# Patient Record
Sex: Male | Born: 1935 | Race: White | Hispanic: No | Marital: Married | Smoking: Never smoker
Health system: Southern US, Community
[De-identification: ages and names within clinical notes are randomized; demographics above are authoritative.]

## PROBLEM LIST (undated history)

## (undated) DIAGNOSIS — E119 Type 2 diabetes mellitus without complications: Secondary | ICD-10-CM

## (undated) DIAGNOSIS — I10 Essential (primary) hypertension: Secondary | ICD-10-CM

## (undated) DIAGNOSIS — N400 Enlarged prostate without lower urinary tract symptoms: Secondary | ICD-10-CM

## (undated) DIAGNOSIS — E785 Hyperlipidemia, unspecified: Secondary | ICD-10-CM

## (undated) DIAGNOSIS — F0390 Unspecified dementia without behavioral disturbance: Secondary | ICD-10-CM

---

## 1998-03-11 ENCOUNTER — Ambulatory Visit (HOSPITAL_COMMUNITY): Admission: RE | Admit: 1998-03-11 | Discharge: 1998-03-11 | Payer: Self-pay | Admitting: Gastroenterology

## 1998-03-26 ENCOUNTER — Encounter: Admission: RE | Admit: 1998-03-26 | Discharge: 1998-06-24 | Payer: Self-pay | Admitting: Internal Medicine

## 2000-03-15 ENCOUNTER — Encounter (INDEPENDENT_AMBULATORY_CARE_PROVIDER_SITE_OTHER): Payer: Self-pay | Admitting: Specialist

## 2000-03-15 ENCOUNTER — Other Ambulatory Visit: Admission: RE | Admit: 2000-03-15 | Discharge: 2000-03-15 | Payer: Self-pay | Admitting: Urology

## 2001-03-18 ENCOUNTER — Encounter: Payer: Self-pay | Admitting: Internal Medicine

## 2001-03-18 ENCOUNTER — Encounter: Admission: RE | Admit: 2001-03-18 | Discharge: 2001-03-18 | Payer: Self-pay | Admitting: Internal Medicine

## 2001-03-27 ENCOUNTER — Ambulatory Visit (HOSPITAL_COMMUNITY): Admission: RE | Admit: 2001-03-27 | Discharge: 2001-03-27 | Payer: Self-pay | Admitting: Internal Medicine

## 2001-03-27 ENCOUNTER — Encounter: Payer: Self-pay | Admitting: Internal Medicine

## 2003-01-12 ENCOUNTER — Ambulatory Visit (HOSPITAL_COMMUNITY): Admission: RE | Admit: 2003-01-12 | Discharge: 2003-01-12 | Payer: Self-pay | Admitting: Specialist

## 2004-12-16 ENCOUNTER — Encounter (INDEPENDENT_AMBULATORY_CARE_PROVIDER_SITE_OTHER): Payer: Self-pay | Admitting: *Deleted

## 2004-12-16 ENCOUNTER — Ambulatory Visit (HOSPITAL_COMMUNITY): Admission: RE | Admit: 2004-12-16 | Discharge: 2004-12-16 | Payer: Self-pay | Admitting: General Surgery

## 2007-05-21 ENCOUNTER — Encounter: Admission: RE | Admit: 2007-05-21 | Discharge: 2007-05-21 | Payer: Self-pay | Admitting: General Surgery

## 2010-03-15 ENCOUNTER — Encounter
Admission: RE | Admit: 2010-03-15 | Discharge: 2010-03-15 | Payer: Self-pay | Source: Home / Self Care | Attending: Internal Medicine | Admitting: Internal Medicine

## 2010-07-08 NOTE — Op Note (Signed)
Brett Mckee, Brett Mckee NO.:  [id number]   MEDICAL RECORD NO.:  [id number]          PATIENT TYPE:  AMB   LOCATION:  SDS                          FACILITY:  [facility]   PHYSICIAN:  Angelia Mould. Derrell Lolling, M.D.DATE OF BIRTH:  Oct 28, 1926   DATE OF PROCEDURE:  12/16/2004  DATE OF DISCHARGE:                                 OPERATIVE REPORT   PREOPERATIVE DIAGNOSIS:  1.  Soft tissue mass right neck, suspect sebaceous cyst.  2.  Pigmented nevus right chest wall.   POSTOPERATIVE DIAGNOSIS:  1.  Soft tissue mass right neck, suspect sebaceous cyst.  2.  Pigmented nevus right chest wall.   OPERATION PERFORMED:  1.  Excision 1.5 cm soft tissue mass of the right neck.  2.  Excision 1 cm pigmented nevus of right chest wall.   SURGEON:  Angelia Mould. Derrell Lolling, M.D.   OPERATIVE INDICATIONS:  This is a 77 year old white male who has had a  cystic soft tissue mass in the right mid anterior neck for 15 years  sometimes draining foul smelling fluid but has never required surgery.  He  also has a pigmented nevus of the right anterior chest wall lateral to the  nipple which is raised and has pigmentation and he is concerned about the  color and diagnosis.  He is brought to the minor procedure room at [facility] for elective excision.   SURGICAL TECHNIQUE:  The patient was placed supine on the operating table  with his neck turned to the left.  He was in a semi-Fowler's position.  The  neck and chest wall were prepped and draped in a sterile fashion.  1%  Xylocaine with epinephrine was used as a local infiltration anesthetic.   We first made a transverse elliptical incision in the right neck overlying  the neck mass.  This was excised down to the deep subcutaneous tissue to get  this all the way out.  This was sent for routine histology.  The  subcutaneous tissues were closed with interrupted sutures of 3-0 Vicryl.  The skin was closed with a running simple suture of 6-0  nylon.   The right chest wall mass, likewise, was excised with a transverse  elliptical incision with about a 1-2 mm margin.  This dissection was carried  down into the mid subcutaneous tissue.  I was able to close this incision  with 5-6 interrupted sutures of 4-0 nylon.   Both of these wounds were cleansed, dried, and covered with a sterile  bandage.  The patient tolerated the procedure well and was returned to the  waiting area in excellent condition.  Estimated blood loss about 5 mL.  Complications none.  Sponge and instrument counts were correct.      Angelia Mould. Derrell Lolling, M.D.  Electronically Signed     HMI/MEDQ  D:  12/16/2004  T:  12/16/2004  Job:  [id number]   cc:   Theressa Millard, M.D.  Fax: [phone]

## 2012-12-01 ENCOUNTER — Emergency Department (HOSPITAL_COMMUNITY): Payer: Medicare Other

## 2012-12-01 ENCOUNTER — Observation Stay (HOSPITAL_COMMUNITY): Payer: Medicare Other

## 2012-12-01 ENCOUNTER — Observation Stay (HOSPITAL_COMMUNITY)
Admission: EM | Admit: 2012-12-01 | Discharge: 2012-12-03 | Payer: Medicare Other | Attending: Internal Medicine | Admitting: Internal Medicine

## 2012-12-01 DIAGNOSIS — R279 Unspecified lack of coordination: Secondary | ICD-10-CM | POA: Insufficient documentation

## 2012-12-01 DIAGNOSIS — Z7982 Long term (current) use of aspirin: Secondary | ICD-10-CM | POA: Insufficient documentation

## 2012-12-01 DIAGNOSIS — N400 Enlarged prostate without lower urinary tract symptoms: Secondary | ICD-10-CM | POA: Insufficient documentation

## 2012-12-01 DIAGNOSIS — R609 Edema, unspecified: Secondary | ICD-10-CM | POA: Diagnosis present

## 2012-12-01 DIAGNOSIS — F0390 Unspecified dementia without behavioral disturbance: Secondary | ICD-10-CM | POA: Diagnosis present

## 2012-12-01 DIAGNOSIS — Z79899 Other long term (current) drug therapy: Secondary | ICD-10-CM | POA: Insufficient documentation

## 2012-12-01 DIAGNOSIS — E860 Dehydration: Secondary | ICD-10-CM | POA: Diagnosis present

## 2012-12-01 DIAGNOSIS — W19XXXA Unspecified fall, initial encounter: Secondary | ICD-10-CM

## 2012-12-01 DIAGNOSIS — R270 Ataxia, unspecified: Secondary | ICD-10-CM

## 2012-12-01 DIAGNOSIS — E785 Hyperlipidemia, unspecified: Secondary | ICD-10-CM | POA: Insufficient documentation

## 2012-12-01 DIAGNOSIS — N179 Acute kidney failure, unspecified: Principal | ICD-10-CM | POA: Diagnosis present

## 2012-12-01 DIAGNOSIS — I10 Essential (primary) hypertension: Secondary | ICD-10-CM | POA: Diagnosis present

## 2012-12-01 HISTORY — DX: Hyperlipidemia, unspecified: E78.5

## 2012-12-01 HISTORY — DX: Benign prostatic hyperplasia without lower urinary tract symptoms: N40.0

## 2012-12-01 HISTORY — DX: Unspecified dementia, unspecified severity, without behavioral disturbance, psychotic disturbance, mood disturbance, and anxiety: F03.90

## 2012-12-01 LAB — URINALYSIS, ROUTINE W REFLEX MICROSCOPIC
Bilirubin Urine: NEGATIVE
Glucose, UA: NEGATIVE mg/dL
Hgb urine dipstick: NEGATIVE
Ketones, ur: NEGATIVE mg/dL
Protein, ur: NEGATIVE mg/dL
Urobilinogen, UA: 0.2 mg/dL (ref 0.0–1.0)

## 2012-12-01 LAB — BASIC METABOLIC PANEL
BUN: 46 mg/dL — ABNORMAL HIGH (ref 6–23)
Calcium: 8.7 mg/dL (ref 8.4–10.5)
GFR calc non Af Amer: 23 mL/min — ABNORMAL LOW (ref >90)
Sodium: 139 mEq/L (ref 135–145)

## 2012-12-01 LAB — CBC WITH DIFFERENTIAL/PLATELET
Basophils Absolute: 0 10*3/uL (ref 0.0–0.1)
Basophils Relative: 0 % (ref 0–1)
Eosinophils Absolute: 0 10*3/uL (ref 0.0–0.7)
Eosinophils Relative: 0 % (ref 0–5)
MCH: 31.7 pg (ref 26.0–34.0)
MCV: 92.7 fL (ref 78.0–100.0)
Neutro Abs: 9.9 10*3/uL — ABNORMAL HIGH (ref 1.7–7.7)
Platelets: 162 10*3/uL (ref 150–400)
RDW: 13.8 % (ref 11.5–15.5)
WBC: 12.4 10*3/uL — ABNORMAL HIGH (ref 4.0–10.5)

## 2012-12-01 LAB — URINE MICROSCOPIC-ADD ON

## 2012-12-01 LAB — TROPONIN I: Troponin I: <0.3 ng/mL (ref <0.30)

## 2012-12-01 LAB — GLUCOSE, CAPILLARY: Glucose-Capillary: 131 mg/dL — ABNORMAL HIGH (ref 70–99)

## 2012-12-01 MED ORDER — SODIUM CHLORIDE 0.9 % IJ SOLN
3.0000 mL | Freq: Two times a day (BID) | INTRAMUSCULAR | Status: DC
  Administered 2012-12-01 – 2012-12-03 (×4): 3 mL via INTRAVENOUS

## 2012-12-01 MED ORDER — ASPIRIN EC 81 MG PO TBEC
81.0000 mg | DELAYED_RELEASE_TABLET | Freq: Every day | ORAL | Status: DC
  Administered 2012-12-02 – 2012-12-03 (×2): 81 mg via ORAL
  Filled 2012-12-01 (×2): qty 1

## 2012-12-01 MED ORDER — ASPIRIN 81 MG PO TABS: 81.0000 mg | ORAL_TABLET | Freq: Every day | ORAL | Status: DC

## 2012-12-01 MED ORDER — HEPARIN SODIUM (PORCINE) 5000 UNIT/ML IJ SOLN
5000.0000 [IU] | Freq: Three times a day (TID) | INTRAMUSCULAR | Status: DC
  Administered 2012-12-02 – 2012-12-03 (×3): 5000 [IU] via SUBCUTANEOUS
  Filled 2012-12-01 (×8): qty 1

## 2012-12-01 MED ORDER — TAMSULOSIN HCL 0.4 MG PO CAPS
0.4000 mg | ORAL_CAPSULE | Freq: Every day | ORAL | Status: DC
  Administered 2012-12-02 – 2012-12-03 (×2): 0.4 mg via ORAL
  Filled 2012-12-01 (×2): qty 1

## 2012-12-01 MED ORDER — MEMANTINE HCL 10 MG PO TABS
10.0000 mg | ORAL_TABLET | Freq: Two times a day (BID) | ORAL | Status: DC
  Administered 2012-12-01 – 2012-12-03 (×4): 10 mg via ORAL
  Filled 2012-12-01 (×5): qty 1

## 2012-12-01 MED ORDER — LORAZEPAM 0.5 MG PO TABS
0.5000 mg | ORAL_TABLET | Freq: Four times a day (QID) | ORAL | Status: DC | PRN
  Administered 2012-12-03: 0.5 mg via ORAL
  Filled 2012-12-01: qty 1

## 2012-12-01 MED ORDER — SODIUM CHLORIDE 0.9 % IV BOLUS (SEPSIS)
1000.0000 mL | Freq: Once | INTRAVENOUS | Status: AC
  Administered 2012-12-01: 1000 mL via INTRAVENOUS

## 2012-12-01 MED ORDER — DONEPEZIL HCL 10 MG PO TABS
10.0000 mg | ORAL_TABLET | Freq: Every day | ORAL | Status: DC
  Administered 2012-12-01 – 2012-12-02 (×2): 10 mg via ORAL
  Filled 2012-12-01 (×3): qty 1

## 2012-12-01 MED ORDER — FAMOTIDINE 20 MG PO TABS
20.0000 mg | ORAL_TABLET | Freq: Every day | ORAL | Status: DC
  Administered 2012-12-02 – 2012-12-03 (×2): 20 mg via ORAL
  Filled 2012-12-01 (×2): qty 1

## 2012-12-01 MED ORDER — ACETAMINOPHEN 325 MG PO TABS: 650.0000 mg | ORAL_TABLET | Freq: Four times a day (QID) | ORAL | Status: DC | PRN

## 2012-12-01 MED ORDER — SIMVASTATIN 20 MG PO TABS
20.0000 mg | ORAL_TABLET | Freq: Every evening | ORAL | Status: DC
  Administered 2012-12-01 – 2012-12-02 (×2): 20 mg via ORAL
  Filled 2012-12-01 (×3): qty 1

## 2012-12-01 MED ORDER — SODIUM CHLORIDE 0.9 % IV SOLN
INTRAVENOUS | Status: DC
  Administered 2012-12-01: 20:00:00 via INTRAVENOUS

## 2012-12-01 NOTE — ED Notes (Signed)
PT was found on floor by staff at [facility] . Pt lives at [facility] .

## 2012-12-01 NOTE — ED Provider Notes (Signed)
CSN: [id number]     Arrival date & time 12/01/12  1212 History   First MD Initiated Contact with Patient 12/01/12 1238     Chief Complaint  Patient presents with  . Fall   (Consider location/radiation/quality/duration/timing/severity/associated sxs/prior Treatment) Patient is a 79 y.o. male presenting with fall.  Fall   Level 5 caveat due to dementia Pt with moderate to severe dementia brought to the ED from SNF where he had unwitnessed fall this AM. Found on the floor, unable to provide any history of the event, but denies any complaints.   No past medical history on file. No past surgical history on file. No family history on file. History  Substance Use Topics  . Smoking status: Not on file  . Smokeless tobacco: Not on file  . Alcohol Use: Not on file    Review of Systems Unable to assess due to mental status.   Allergies  Review of patient's allergies indicates not on file.  Home Medications   Current Outpatient Rx  Name  Route  Sig  Dispense  Refill  . acetaminophen (TYLENOL) 500 MG tablet   Oral   Take 1,000 mg by mouth 2 (two) times daily.         Marland Kitchen aspirin 81 MG tablet   Oral   Take 81 mg by mouth daily.         Marland Kitchen donepezil (ARICEPT) 10 MG tablet   Oral   Take 10 mg by mouth at bedtime as needed.         . furosemide (LASIX) 40 MG tablet   Oral   Take 40 mg by mouth 2 (two) times daily.         Marland Kitchen LORazepam (ATIVAN) 0.5 MG tablet   Oral   Take 0.5 mg by mouth every 6 (six) hours as needed for anxiety (agitation).         . memantine (NAMENDA) 10 MG tablet   Oral   Take 10 mg by mouth 2 (two) times daily.         . quinapril (ACCUPRIL) 40 MG tablet   Oral   Take 40 mg by mouth daily.         . ranitidine (ZANTAC) 75 MG tablet   Oral   Take 150 mg by mouth 2 (two) times daily.         . simvastatin (ZOCOR) 20 MG tablet   Oral   Take 20 mg by mouth every evening.         . tamsulosin (FLOMAX) 0.4 MG CAPS capsule   Oral  Take 0.4 mg by mouth daily.          BP 121/58  Pulse 73  Temp(Src) 97.4 F (36.3 C) (Oral)  Resp 20  SpO2 100% Physical Exam  Nursing note and vitals reviewed. Constitutional: He appears well-developed and well-nourished.  HENT:  Head: Atraumatic.  Abrasion R frontal scalp  Eyes: EOM are normal. Pupils are equal, round, and reactive to light.  Neck:  In c-collar  Cardiovascular: Normal rate, normal heart sounds and intact distal pulses.   Pulmonary/Chest: Effort normal and breath sounds normal.  Abdominal: Bowel sounds are normal. He exhibits no distension. There is no tenderness.  Musculoskeletal: Normal range of motion. He exhibits no edema and no tenderness.  Neurological: He is alert. He has normal strength. No cranial nerve deficit or sensory deficit.  Skin: Skin is warm and dry. No rash noted.  Psychiatric: He has a normal mood  and affect.    ED Course  Procedures (including critical care time) Labs Review Labs Reviewed  CBC WITH DIFFERENTIAL - Abnormal; Notable for the following:    WBC 12.4 (*)    Neutrophils Relative % 80 (*)    Neutro Abs 9.9 (*)    All other components within normal limits  BASIC METABOLIC PANEL - Abnormal; Notable for the following:    Glucose, Bld 170 (*)    BUN 46 (*)    Creatinine, Ser 2.38 (*)    GFR calc non Af Amer 23 (*)    GFR calc Af Amer 27 (*)    All other components within normal limits  TROPONIN I  URINALYSIS, ROUTINE W REFLEX MICROSCOPIC   Imaging Review Dg Chest 2 View  12/01/2012   CLINICAL DATA:  History of fall. Syncope. Confusion.  EXAM: CHEST  2 VIEW  COMPARISON:  CHEST x-ray 05/21/2007.  FINDINGS: Low lung volumes. There is cephalization of the pulmonary vasculature and slight indistinctness of the interstitial markings suggestive of mild pulmonary edema. Trace bilateral pleural effusions. Mild cardiomegaly. The patient is rotated to the left on today's exam, resulting in distortion of the mediastinal contours and  reduced diagnostic sensitivity and specificity for mediastinal pathology. Atherosclerosis in the thoracic aorta.  IMPRESSION: 1. Findings, as above, concerning for mild congestive heart failure. 2. Atherosclerosis.   Electronically Signed   By: Trudie Reed M.D.   On: 12/01/2012 13:43    EKG Interpretation     Ventricular Rate:  73 PR Interval:  190 QRS Duration: 110 QT Interval:  376 QTC Calculation: 415 R Axis:   56 Text Interpretation:  Sinus rhythm Inferior infarct, old No old tracing to compare            MDM   1. Fall, initial encounter   2. Acute renal failure   3. Ataxia     Family at bedside states patient is at baseline. He is normally independent, does not use walker or wheelchair. Will ensure he is able to ambulate without difficulty and plan to return to SNF. Family amenable to this plan.   3:57 PM Pt with elevated Cr with no history of same per daughter. He is ataxic with trying to walk, not safe to go back to SNF. Will plan admission for further eval. IVF ordered. UA pending.   Jaycee Mckellips B. Bernette Mayers, MD 12/01/12 1558

## 2012-12-01 NOTE — H&P (Signed)
[facility] History and Physical  Brett Dora Clauss ZOX:[id number] DOB: Jun 14, 1926 DOA: 12/01/2012  Referring physician:  PCP: No primary provider on file.  Specialists:   Chief Complaint: fall, ? syncope   HPI: Brett Mckee is a 79 y.o. male with PMH of HTN, DJD, HPL, BPH, dementia presented with fall. He was brought from assisted living where he had unwitnessed fall; He has dementia and pleasantly confused, denies chest pain, no SOB, no fever, no cough, no focal neuro symptoms, no nausea, vomiting or diarrhea;  unable to provide any history of the event   Review of Systems: The patient denies anorexia, fever, weight loss,, vision loss, decreased hearing, hoarseness, chest pain, dyspnea on exertion, peripheral edema,hemoptysis, abdominal pain, melena, hematochezia, severe indigestion/heartburn, hematuria, incontinence, genital sores, muscle weakness, suspicious skin lesions, transient blindness, difficulty walking, depression, unusual weight change, abnormal bleeding, enlarged lymph nodes, angioedema, and breast masses.    No past medical history on file. No past surgical history on file. Social History:  has no tobacco, alcohol, and drug history on file. ALF;  where does patient live--home, ALF, SNF? and with whom if at home? Yes:  Can patient participate in ADLs?  Not on File  No family history on file.  Denies h/o MI, CAD  (be sure to complete)  Prior to Admission medications   Medication Sig Start Date End Date Taking? Authorizing Provider  acetaminophen (TYLENOL) 500 MG tablet Take 1,000 mg by mouth 2 (two) times daily.   Yes Historical Provider, MD  aspirin 81 MG tablet Take 81 mg by mouth daily.   Yes Historical Provider, MD  donepezil (ARICEPT) 10 MG tablet Take 10 mg by mouth at bedtime as needed.   Yes Historical Provider, MD  furosemide (LASIX) 40 MG tablet Take 40 mg by mouth 2 (two) times daily.   Yes Historical Provider, MD  LORazepam (ATIVAN) 0.5 MG tablet  Take 0.5 mg by mouth every 6 (six) hours as needed for anxiety (agitation).   Yes Historical Provider, MD  memantine (NAMENDA) 10 MG tablet Take 10 mg by mouth 2 (two) times daily.   Yes Historical Provider, MD  quinapril (ACCUPRIL) 40 MG tablet Take 40 mg by mouth daily.   Yes Historical Provider, MD  ranitidine (ZANTAC) 75 MG tablet Take 150 mg by mouth 2 (two) times daily.   Yes Historical Provider, MD  simvastatin (ZOCOR) 20 MG tablet Take 20 mg by mouth every evening.   Yes Historical Provider, MD  tamsulosin (FLOMAX) 0.4 MG CAPS capsule Take 0.4 mg by mouth daily.   Yes Historical Provider, MD   Physical Exam: Filed Vitals:   12/01/12 1236  BP: 121/58  Pulse: 73  Temp: 97.4 F (36.3 C)  Resp: 20     General:  Alert, confused   Eyes: eom-i  ENT: no oral ulcers   Neck: supple,   Cardiovascular: s1,s2 rrr  Respiratory: CTA bl   Abdomen: soft, nt, nd   Skin: ecchymosis   Musculoskeletal: LE edema   Psychiatric: no hallucinations   Neurologic: CN 2-12 intact, motor 5/5 bl symmetric   Labs on Admission:  Basic Metabolic Panel:  Recent Labs Lab 12/01/12 1255  NA 139  K 4.2  CL 100  CO2 27  GLUCOSE 170*  BUN 46*  CREATININE 2.38*  CALCIUM 8.7   Liver Function Tests: No results found for this basename: AST, ALT, ALKPHOS, BILITOT, PROT, ALBUMIN,  in the last 168 hours No results found for this basename: LIPASE, AMYLASE,  in the last 168 hours No results found for this basename: AMMONIA,  in the last 168 hours CBC:  Recent Labs Lab 12/01/12 1255  WBC 12.4*  NEUTROABS 9.9*  HGB 13.4  HCT 39.2  MCV 92.7  PLT 162   Cardiac Enzymes:  Recent Labs Lab 12/01/12 1255  TROPONINI <0.30    BNP (last 3 results) No results found for this basename: PROBNP,  in the last 8760 hours CBG: No results found for this basename: GLUCAP,  in the last 168 hours  Radiological Exams on Admission: Dg Chest 2 View  12/01/2012   CLINICAL DATA:  History of fall.  Syncope. Confusion.  EXAM: CHEST  2 VIEW  COMPARISON:  CHEST x-ray 05/21/2007.  FINDINGS: Low lung volumes. There is cephalization of the pulmonary vasculature and slight indistinctness of the interstitial markings suggestive of mild pulmonary edema. Trace bilateral pleural effusions. Mild cardiomegaly. The patient is rotated to the left on today's exam, resulting in distortion of the mediastinal contours and reduced diagnostic sensitivity and specificity for mediastinal pathology. Atherosclerosis in the thoracic aorta.  IMPRESSION: 1. Findings, as above, concerning for mild congestive heart failure. 2. Atherosclerosis.   Electronically Signed   By: Trudie Reed M.D.   On: 12/01/2012 13:43   Ct Head Wo Contrast  12/01/2012   CLINICAL DATA:  79 year old male with fall, headache and neck pain.  EXAM: CT HEAD WITHOUT CONTRAST  CT CERVICAL SPINE WITHOUT CONTRAST  TECHNIQUE: Multidetector CT imaging of the head and cervical spine was performed following the standard protocol without intravenous contrast. Multiplanar CT image reconstructions of the cervical spine were also generated.  COMPARISON:  03/15/2010 head CT  FINDINGS: CT HEAD FINDINGS  Generalized cerebral volume loss and chronic small-vessel white matter ischemic changes are again noted.  No acute intracranial abnormalities are identified, including mass lesion or mass effect, hydrocephalus, extra-axial fluid collection, midline shift, hemorrhage, or acute infarction.  The visualized bony calvarium is unremarkable.  Small air-fluid levels within the ethmoid and left maxillary sinus is noted.  CT CERVICAL SPINE FINDINGS  Reversal of the normal cervical lordosis is noted.  There is no evidence of acute fracture, subluxation or prevertebral soft tissue swelling.  Multilevel degenerative disc disease and facet arthropathy noted throughout the cervical spine, moderate to severe at C5-6 and C6-7. These degenerative changes contribute to moderate to severe  central spinal and by foraminal narrowing at C5-6 and C6-7. Mild central spinal and foraminal narrowing is identified at several other levels.  The soft tissue structures are unremarkable.  IMPRESSION: CT HEAD IMPRESSION:  No evidence of acute intracranial abnormality.  Atrophy and chronic small-vessel white matter ischemic changes.  Sinus disease as described.  CT CERVICAL SPINE IMPRESSION:  No static evidence of acute injury to the cervical spine.  Multilevel degenerative changes, greatest at C5-6 and C6-7, contributing to moderate to severe central spinal and bony foraminal narrowing from C5-C7.   Electronically Signed   By: Laveda Abbe M.D.   On: 12/01/2012 14:25   Ct Cervical Spine Wo Contrast  12/01/2012   CLINICAL DATA:  79 year old male with fall, headache and neck pain.  EXAM: CT HEAD WITHOUT CONTRAST  CT CERVICAL SPINE WITHOUT CONTRAST  TECHNIQUE: Multidetector CT imaging of the head and cervical spine was performed following the standard protocol without intravenous contrast. Multiplanar CT image reconstructions of the cervical spine were also generated.  COMPARISON:  03/15/2010 head CT  FINDINGS: CT HEAD FINDINGS  Generalized cerebral volume loss and chronic small-vessel white matter  ischemic changes are again noted.  No acute intracranial abnormalities are identified, including mass lesion or mass effect, hydrocephalus, extra-axial fluid collection, midline shift, hemorrhage, or acute infarction.  The visualized bony calvarium is unremarkable.  Small air-fluid levels within the ethmoid and left maxillary sinus is noted.  CT CERVICAL SPINE FINDINGS  Reversal of the normal cervical lordosis is noted.  There is no evidence of acute fracture, subluxation or prevertebral soft tissue swelling.  Multilevel degenerative disc disease and facet arthropathy noted throughout the cervical spine, moderate to severe at C5-6 and C6-7. These degenerative changes contribute to moderate to severe central spinal and by  foraminal narrowing at C5-6 and C6-7. Mild central spinal and foraminal narrowing is identified at several other levels.  The soft tissue structures are unremarkable.  IMPRESSION: CT HEAD IMPRESSION:  No evidence of acute intracranial abnormality.  Atrophy and chronic small-vessel white matter ischemic changes.  Sinus disease as described.  CT CERVICAL SPINE IMPRESSION:  No static evidence of acute injury to the cervical spine.  Multilevel degenerative changes, greatest at C5-6 and C6-7, contributing to moderate to severe central spinal and bony foraminal narrowing from C5-C7.   Electronically Signed   By: Laveda Abbe M.D.   On: 12/01/2012 14:25    EKG: Independently reviewed. NSR, no acute ST/T  Changes   Assessment/Plan Active Problems:   Fall   AKI (acute kidney injury)   Dementia   HTN (hypertension)  79 y.o. male with PMH of HTN, DJD, HPL, BPH, dementia presented with fall and possible syncope   1. Fall, ? Syncope of unclear etiology at this time; neuro exam no focal; CT head: no acute findings; -Brett/o infection, pend UA, CXR; no clear infiltrate;  monitor on tele; IVF gentle;    2. AKI; no previous renal function data available, but per family Cr was normal;  -IVF gentle; obtain UA, renal US Brett/o obstruction with BPH;  -hold ACE, diuretics   3. HTN stable; cont home regimen; hold diuretics, ACE   4. DJD; ? C spine myelopathy on CT but no significant findings on exam contributing to fall -obtain PT/OT eval; cont tylenol prn;   5. Possible chronic CHF; CXR: congestion; clinically chronic volume overloaded, but no distress;  -obtain echo; gentle IVF challenge for 1 liter due to AKI; daily weight, I/O'; hold diuretic, recheck renal function in AM   6. Dementia; confused at baseline; cont current regimen    None:  if consultant consulted, please document name and whether formally or informally consulted  Code Status: DNR; d/w family at the bedside; son is DPOA, daughter present and they  confirmed DNR status (must indicate code status--if unknown or must be presumed, indicate so) Family Communication: family at the bedside (indicate person spoken with, if applicable, with phone number if by telephone) Disposition Plan: pend PT (indicate anticipated LOS)  Time spent: >40  Esperanza Sheets [facility] Pager [phone]  If 7PM-7AM, please contact night-coverage [email or web address] Password [facility] 12/01/2012, 4:28 PM

## 2012-12-02 ENCOUNTER — Encounter (HOSPITAL_COMMUNITY): Payer: Self-pay | Admitting: *Deleted

## 2012-12-02 ENCOUNTER — Observation Stay (HOSPITAL_COMMUNITY): Payer: Medicare Other

## 2012-12-02 DIAGNOSIS — I059 Rheumatic mitral valve disease, unspecified: Secondary | ICD-10-CM

## 2012-12-02 DIAGNOSIS — N179 Acute kidney failure, unspecified: Secondary | ICD-10-CM

## 2012-12-02 DIAGNOSIS — F0390 Unspecified dementia without behavioral disturbance: Secondary | ICD-10-CM

## 2012-12-02 DIAGNOSIS — W19XXXA Unspecified fall, initial encounter: Secondary | ICD-10-CM

## 2012-12-02 LAB — CBC
Hemoglobin: 12 g/dL — ABNORMAL LOW (ref 13.0–17.0)
MCH: 30.9 pg (ref 26.0–34.0)
MCHC: 33 g/dL (ref 30.0–36.0)
MCV: 93.8 fL (ref 78.0–100.0)
Platelets: 149 10*3/uL — ABNORMAL LOW (ref 150–400)
RBC: 3.88 MIL/uL — ABNORMAL LOW (ref 4.22–5.81)
RDW: 14.2 % (ref 11.5–15.5)

## 2012-12-02 LAB — BASIC METABOLIC PANEL
CO2: 26 mEq/L (ref 19–32)
Calcium: 8.2 mg/dL — ABNORMAL LOW (ref 8.4–10.5)
Creatinine, Ser: 1.51 mg/dL — ABNORMAL HIGH (ref 0.50–1.35)
GFR calc Af Amer: 47 mL/min — ABNORMAL LOW (ref >90)
GFR calc non Af Amer: 41 mL/min — ABNORMAL LOW (ref >90)
Sodium: 140 mEq/L (ref 135–145)

## 2012-12-02 LAB — PRO B NATRIURETIC PEPTIDE: Pro B Natriuretic peptide (BNP): 290.3 pg/mL (ref 0–450)

## 2012-12-02 LAB — GLUCOSE, CAPILLARY: Glucose-Capillary: 174 mg/dL — ABNORMAL HIGH (ref 70–99)

## 2012-12-02 MED ORDER — SODIUM CHLORIDE 0.9 % IV SOLN
INTRAVENOUS | Status: DC
  Administered 2012-12-02: 08:00:00 via INTRAVENOUS

## 2012-12-02 NOTE — Evaluation (Signed)
Physical Therapy Evaluation Patient Details Name: Brett Mckee MRN: [id number] DOB: Apr 10, 1926 Today's Date: 12/02/2012 Time: 4098-1191 PT Time Calculation (min): 21 min  PT Assessment / Plan / Recommendation History of Present Illness  79 y.o. male with PMH of HTN, DJD, HPL, BPH, dementia presented with fall. He was brought from assisted living where he had unwitnessed fall; He has dementia and pleasantly confused, denies chest pain, no SOB, no fever, no cough, no focal neuro symptoms, no nausea, vomiting or diarrhea;  unable to provide any history of the event  Clinical Impression  Pt admitted with unwitnessed fall at ALF. Pt currently with functional limitations due to the deficits listed below (see PT Problem List).  Pt will benefit from skilled PT to increase their independence and safety with mobility to allow discharge to the venue listed below.  Pt able to perform mobility today with min assist for occasional LOB.  Daughter reports no falls in past 16 months.  Pt with slight pitting edema and tender to touch R calf area.  Recommend initial assist OOB (due to min assist today) upon d/c and if ALF feels unable to provide required assist, pt may need ST-SNF.     PT Assessment  Patient needs continued PT services    Follow Up Recommendations  Home health PT;Supervision for mobility/OOB    Does the patient have the potential to tolerate intense rehabilitation      Barriers to Discharge        Equipment Recommendations  None recommended by PT    Recommendations for Other Services     Frequency Min 3X/week    Precautions / Restrictions Precautions Precautions: Fall   Pertinent Vitals/Pain HR 70 bpm upon return to room from ambulation, pt reports pain "all over" not rated from fall however agreeable to mobilize      Mobility  Bed Mobility Bed Mobility: Supine to Sit;Sit to Supine Supine to Sit: 4: Min assist;HOB elevated Sit to Supine: 5: Supervision;HOB  elevated Details for Bed Mobility Assistance: assist for trunk upright, verbal cues for technique and scooting to EOB Transfers Transfers: Sit to Stand;Stand to Sit Sit to Stand: 4: Min assist;With upper extremity assist;From bed Stand to Sit: 4: Min assist;With upper extremity assist;To bed Details for Transfer Assistance: assist to rise and control descent Ambulation/Gait Ambulation/Gait Assistance: 4: Min assist Ambulation Distance (Feet): 150 Feet Assistive device: None Ambulation/Gait Assistance Details: min assist for LOB 3-4x, pt also with increased speed due to anterior COG with ambulation head first Gait Pattern: Step-through pattern;Decreased dorsiflexion - left General Gait Details: HR 70 bpm upon return to bed after ambulation    Exercises     PT Diagnosis: Generalized weakness;Difficulty walking  PT Problem List: Decreased strength;Decreased activity tolerance;Decreased balance;Decreased mobility PT Treatment Interventions: DME instruction;Gait training;Functional mobility training;Neuromuscular re-education;Balance training;Therapeutic exercise;Therapeutic activities;Patient/family education     PT Goals(Current goals can be found in the care plan section) Acute Rehab PT Goals PT Goal Formulation: With patient Time For Goal Achievement: 12/16/12 Potential to Achieve Goals: Good  Visit Information  Last PT Received On: 12/02/12 Assistance Needed: +1 History of Present Illness: 79 y.o. male with PMH of HTN, DJD, HPL, BPH, dementia presented with fall. He was brought from assisted living where he had unwitnessed fall; He has dementia and pleasantly confused, denies chest pain, no SOB, no fever, no cough, no focal neuro symptoms, no nausea, vomiting or diarrhea;  unable to provide any history of the event       Prior  Functioning  Home Living Family/patient expects to be discharged to:: Assisted living Home Equipment: None Prior Function Level of Independence:  Independent Comments: independent with mobility, assist for ADLs per daughter Communication Communication: No difficulties    Cognition  Cognition Arousal/Alertness: Awake/alert Behavior During Therapy: WFL for tasks assessed/performed Overall Cognitive Status: History of cognitive impairments - at baseline    Extremity/Trunk Assessment Lower Extremity Assessment Lower Extremity Assessment: Generalized weakness   Balance    End of Session PT - End of Session Equipment Utilized During Treatment: Gait belt Activity Tolerance: Patient tolerated treatment well Patient left: in bed;with call bell/phone within reach;with family/visitor present;with bed alarm set  GP Functional Assessment Tool Used: clinical judgement Functional Limitation: Mobility: Walking and moving around Mobility: Walking and Moving Around Current Status ([id number]): At least 20 percent but less than 40 percent impaired, limited or restricted Mobility: Walking and Moving Around Goal Status [phone]): 0 percent impaired, limited or restricted   Jaelyn Cloninger,KATHrine E 12/02/2012, 2:52 PM Zenovia Jarred, PT, DPT 12/02/2012 Pager: [phone]

## 2012-12-02 NOTE — Progress Notes (Signed)
[facility] called back to inform me that the patient needs the PNA and the flu vaccination. I went into the room to speak with the patient and his daughter to inform them. Pt declined both vaccines and daughter supported his decision. Daughter said he had an appointment this Wednesday with Dr. Particia Lather and she said that if Dr. Particia Lather mentions it then he will get it.

## 2012-12-02 NOTE — Progress Notes (Addendum)
Clinical Social Work Department BRIEF PSYCHOSOCIAL ASSESSMENT 12/02/2012  Patient:  Brett Mckee, Brett Mckee     Account Number:  [id number]     Admit date:  12/01/2012  Clinical Social Worker:  Harless Nakayama  Date/Time:  12/02/2012 10:30 AM  Referred by:  Physician  Date Referred:  12/02/2012 Referred for  ALF Placement   Other Referral:   Interview type:  Other - See comment Other interview type:   Left message for pt daughter and spoke with facility    PSYCHOSOCIAL DATA Living Status:  FACILITY Admitted from facility:  [facility] Level of care:  Assisted Living Primary support name:  Stark Jock [phone] Primary support relationship to patient:  CHILD, ADULT Degree of support available:   Unable to assess at this time.    CURRENT CONCERNS Current Concerns  Post-Acute Placement   Other Concerns:    SOCIAL WORK ASSESSMENT / PLAN CSW informed that pt was admitted from [facility]. CSW left message with pt daughter Stark Jock to inquire if plan was to return to [facility]. CSW spoke with Marcelino Duster at [facility] who reported that in order for pt to return they would need to come out and evaluate prior to return. CSW informed facility that pt may likley be ready for dc tomorrow. Marcelino Duster to relay message to nurse, Karen Kitchens, and ask Karen Kitchens to call CSW back. CSW faxed H&P, med list, and FL2.   Assessment/plan status:  Psychosocial Support/Ongoing Assessment of Needs Other assessment/ plan:   Information/referral to community resources:   None needed    PATIENT'S/FAMILY'S RESPONSE TO PLAN OF CARE: Have not been able to speak with family at this time.       Sebastyan Snodgrass, LCSWA [phone]

## 2012-12-02 NOTE — Progress Notes (Signed)
CSW (Clinical Child psychotherapist) received return phone call from [place] with [facility] who said everything looks okay for pt to return to facility when ready for dc. Facility will need updated med list though when dc meds are finalized. CSW to send when AVS available.  Oriyah Lamphear, LCSWA [phone]

## 2012-12-02 NOTE — Progress Notes (Signed)
  Echocardiogram 2D Echocardiogram has been performed.  Arvil Chaco 12/02/2012, 4:56 PM

## 2012-12-02 NOTE — Progress Notes (Signed)
Called [facility] for vaccination information since family does not know if he has had the flu or PNA vaccination. [facility] is looking into his records they should call the floor [facility] at [phone] to relay that information.

## 2012-12-02 NOTE — Progress Notes (Signed)
Patient ID: Brett Mckee  male  ZOX:[id number]    DOB: 1926-08-13    DOA: 12/01/2012  PCP: Darnelle Bos, MD  Assessment/Plan: Principal Problem:   AKI (acute kidney injury): Likely due to increased Lasix 6 weeks ago (40mg  BID) and lisinopril 40 mg daily - Creatinine improving, 1.5 today, renal ultrasound pending to rule out obstruction - Gentle hydration for ILiter only, then [facility]  Active Problems:   Fall: PT evaluation pending, may need higher level of care    Dementia:  moderate to severe but very pleasant     HTN (hypertension)  Pedal edema : Per patient's daughter Lasix is due to pedal edema although no echo on the record if we have any chronic CHF versus pedal edema is due to vascular/venous insufficiency.  - 2-D echo pending, BNP ordered, TED hoses   DVT Prophylaxis:  Code Status: DNR  Family communication : Discussed with patient and his daughter at the bedside   Disposition:Hopefully tomorrow. Discussed in detail with patient's family care physician, Dr. Theressa Millard, will assume care in a.m. Ok with changing attending to Dr. Earl Gala. I will sign off   Subjective: No specific complaints at this time, patient is very pleasant and talking about his [employer] days   Objective: Weight change:   Intake/Output Summary (Last 24 hours) at 12/02/12 1019 Last data filed at 12/02/12 0906  Gross per 24 hour  Intake   2785 ml  Output   1000 ml  Net   1785 ml   Blood pressure 130/48, pulse 89, temperature 98.3 F (36.8 C), temperature source Oral, resp. rate 18, weight 97.1 kg (214 lb 1.1 oz), SpO2 95.00%.  Physical Exam: General: Alert and awake, not in any acute distress. CVS: S1-S2 clear, no murmur rubs or gallops Chest:  mild bibasal crackles  Abdomen: soft nontender, nondistended, normal bowel sounds  Extremities: no cyanosis, clubbing, 1+ edema noted bilaterally Neuro: Cranial nerves II-XII intact, no focal neurological deficits  Lab Results: Basic  Metabolic Panel:  Recent Labs Lab 12/01/12 1255 12/02/12 0522  NA 139 140  K 4.2 3.8  CL 100 107  CO2 27 26  GLUCOSE 170* 131*  BUN 46* 34*  CREATININE 2.38* 1.51*  CALCIUM 8.7 8.2*   CBC:  Recent Labs Lab 12/01/12 1255 12/02/12 0522  WBC 12.4* 9.1  NEUTROABS 9.9*  --   HGB 13.4 12.0*  HCT 39.2 36.4*  MCV 92.7 93.8  PLT 162 149*   Cardiac Enzymes:  Recent Labs Lab 12/01/12 1255  TROPONINI <0.30   BNP: No components found with this basename: POCBNP,  CBG:  Recent Labs Lab 12/01/12 2121 12/02/12 0757  GLUCAP 131* 174*     Micro Results: No results found for this or any previous visit (from the past 240 hour(s)).  Studies/Results: Dg Chest 2 View  12/01/2012   CLINICAL DATA:  History of fall. Syncope. Confusion.  EXAM: CHEST  2 VIEW  COMPARISON:  CHEST x-ray 05/21/2007.  FINDINGS: Low lung volumes. There is cephalization of the pulmonary vasculature and slight indistinctness of the interstitial markings suggestive of mild pulmonary edema. Trace bilateral pleural effusions. Mild cardiomegaly. The patient is rotated to the left on today's exam, resulting in distortion of the mediastinal contours and reduced diagnostic sensitivity and specificity for mediastinal pathology. Atherosclerosis in the thoracic aorta.  IMPRESSION: 1. Findings, as above, concerning for mild congestive heart failure. 2. Atherosclerosis.   Electronically Signed   By: Trudie Reed M.D.   On: 12/01/2012 13:43  Ct Head Wo Contrast  12/01/2012   CLINICAL DATA:  79 year old male with fall, headache and neck pain.  EXAM: CT HEAD WITHOUT CONTRAST  CT CERVICAL SPINE WITHOUT CONTRAST  TECHNIQUE: Multidetector CT imaging of the head and cervical spine was performed following the standard protocol without intravenous contrast. Multiplanar CT image reconstructions of the cervical spine were also generated.  COMPARISON:  03/15/2010 head CT  FINDINGS: CT HEAD FINDINGS  Generalized cerebral volume loss  and chronic small-vessel white matter ischemic changes are again noted.  No acute intracranial abnormalities are identified, including mass lesion or mass effect, hydrocephalus, extra-axial fluid collection, midline shift, hemorrhage, or acute infarction.  The visualized bony calvarium is unremarkable.  Small air-fluid levels within the ethmoid and left maxillary sinus is noted.  CT CERVICAL SPINE FINDINGS  Reversal of the normal cervical lordosis is noted.  There is no evidence of acute fracture, subluxation or prevertebral soft tissue swelling.  Multilevel degenerative disc disease and facet arthropathy noted throughout the cervical spine, moderate to severe at C5-6 and C6-7. These degenerative changes contribute to moderate to severe central spinal and by foraminal narrowing at C5-6 and C6-7. Mild central spinal and foraminal narrowing is identified at several other levels.  The soft tissue structures are unremarkable.  IMPRESSION: CT HEAD IMPRESSION:  No evidence of acute intracranial abnormality.  Atrophy and chronic small-vessel white matter ischemic changes.  Sinus disease as described.  CT CERVICAL SPINE IMPRESSION:  No static evidence of acute injury to the cervical spine.  Multilevel degenerative changes, greatest at C5-6 and C6-7, contributing to moderate to severe central spinal and bony foraminal narrowing from C5-C7.   Electronically Signed   By: Laveda Abbe M.D.   On: 12/01/2012 14:25   Ct Cervical Spine Wo Contrast  12/01/2012   CLINICAL DATA:  79 year old male with fall, headache and neck pain.  EXAM: CT HEAD WITHOUT CONTRAST  CT CERVICAL SPINE WITHOUT CONTRAST  TECHNIQUE: Multidetector CT imaging of the head and cervical spine was performed following the standard protocol without intravenous contrast. Multiplanar CT image reconstructions of the cervical spine were also generated.  COMPARISON:  03/15/2010 head CT  FINDINGS: CT HEAD FINDINGS  Generalized cerebral volume loss and chronic small-vessel  white matter ischemic changes are again noted.  No acute intracranial abnormalities are identified, including mass lesion or mass effect, hydrocephalus, extra-axial fluid collection, midline shift, hemorrhage, or acute infarction.  The visualized bony calvarium is unremarkable.  Small air-fluid levels within the ethmoid and left maxillary sinus is noted.  CT CERVICAL SPINE FINDINGS  Reversal of the normal cervical lordosis is noted.  There is no evidence of acute fracture, subluxation or prevertebral soft tissue swelling.  Multilevel degenerative disc disease and facet arthropathy noted throughout the cervical spine, moderate to severe at C5-6 and C6-7. These degenerative changes contribute to moderate to severe central spinal and by foraminal narrowing at C5-6 and C6-7. Mild central spinal and foraminal narrowing is identified at several other levels.  The soft tissue structures are unremarkable.  IMPRESSION: CT HEAD IMPRESSION:  No evidence of acute intracranial abnormality.  Atrophy and chronic small-vessel white matter ischemic changes.  Sinus disease as described.  CT CERVICAL SPINE IMPRESSION:  No static evidence of acute injury to the cervical spine.  Multilevel degenerative changes, greatest at C5-6 and C6-7, contributing to moderate to severe central spinal and bony foraminal narrowing from C5-C7.   Electronically Signed   By: Laveda Abbe M.D.   On: 12/01/2012 14:25  Medications: Scheduled Meds: . aspirin EC  81 mg Oral Daily  . donepezil  10 mg Oral QHS  . famotidine  20 mg Oral Daily  . heparin  5,000 Units Subcutaneous Q8H  . memantine  10 mg Oral BID  . simvastatin  20 mg Oral QPM  . sodium chloride  3 mL Intravenous Q12H  . tamsulosin  0.4 mg Oral Daily      LOS: 1 day   RAI,RIPUDEEP M.D. [facility] 12/02/2012, 10:19 AM Pager: [phone]  If 7PM-7AM, please contact night-coverage [email or web address] Password [email or web address]

## 2012-12-03 DIAGNOSIS — R609 Edema, unspecified: Secondary | ICD-10-CM | POA: Diagnosis present

## 2012-12-03 DIAGNOSIS — E860 Dehydration: Secondary | ICD-10-CM | POA: Diagnosis present

## 2012-12-03 LAB — BASIC METABOLIC PANEL
BUN: 22 mg/dL (ref 6–23)
CO2: 25 mEq/L (ref 19–32)
Chloride: 108 mEq/L (ref 96–112)
Creatinine, Ser: 1.15 mg/dL (ref 0.50–1.35)
GFR calc Af Amer: 66 mL/min — ABNORMAL LOW (ref >90)
Sodium: 142 mEq/L (ref 135–145)

## 2012-12-03 MED ORDER — FUROSEMIDE 40 MG PO TABS: ORAL_TABLET | ORAL | Status: DC

## 2012-12-03 MED ORDER — INFLUENZA VAC SPLIT QUAD 0.5 ML IM SUSP: 0.5000 mL | INTRAMUSCULAR | Status: DC

## 2012-12-03 MED ORDER — INFLUENZA VAC SPLIT QUAD 0.5 ML IM SUSP
0.5000 mL | Freq: Once | INTRAMUSCULAR | Status: DC
  Filled 2012-12-03: qty 0.5

## 2012-12-03 NOTE — Progress Notes (Signed)
CSW (Clinical Child psychotherapist) faxed dc summary, AVS, and updated FL2 with meds to facility. Pt daughter, Dewayne Hatch, to transport pt back to facility. CSW to notify pt daughter when dc confirmed with facility and dc time known.  Jerl Munyan, LCSWA [phone]

## 2012-12-03 NOTE — Progress Notes (Signed)
CSW (Clinical Child psychotherapist) spoke with facility who confirmed pt was okay to dc back today. CSW prepared pt dc packet and placed with shadow chart. CSW informed pt daughter that pt was ready to dc, she reported she would be at the hospital within the next 30 minutes. Pt nurse aware. CSW signing off.  Hershey Knauer, LCSWA [phone]

## 2012-12-03 NOTE — Progress Notes (Signed)
PT Cancellation Note  Patient Details Name: Brett Mckee MRN: [id number] DOB: Sep 03, 1926   Cancelled Treatment:     Pt getting ready to d/c back to ALF.      Verdell Face, [place] [phone] 12/03/2012

## 2012-12-03 NOTE — Discharge Summary (Signed)
Physician Discharge Summary  NAME:R Tremaine Earwood  ZOX:[id number]  DOB: 27-Jul-1926   Admit date: 12/01/2012 Discharge date: 12/03/2012  Admitting Diagnosis: fall and acute renal injury  Discharge Diagnoses:  Active Hospital Problems   Diagnosis Date Noted  . Dehydration - due to diuresis 12/03/2012  . Edema - no evidence of CHF.  12/03/2012  . Fall 12/01/2012  . AKI (acute kidney injury) - resolved.  12/01/2012  . Dementia 12/01/2012  . HTN (hypertension) 12/01/2012     Things to follow up in the outpatient setting: Recheck BMET on return to office  Hospital Course: Patient was admitted and found to have creatinine of 2.5. This was presumed to be due to vigorous diuresis for chronic severe edema of unknown etiology. He has been evaluated in the office extensively and had another echo here that did NOT show any evidence of CHF. I have suspected that the swelling was due to relative inactivity in his ALF. I was concerned about development of stasis dermatitis and was to see him back on 10/15. However, he became volume depleted and was admitted.   His renal function returned to normal. He was able to get up easily on the morning of discharge. We may need to consider HHPT after he returns to ALF, but it is not clear this is needed at this time.   Discharge Condition: improved  Consults: none  Disposition:   Discharge Orders   Future Orders Complete By Expires   Diet - low sodium heart healthy  As directed    Increase activity slowly  As directed        Medication List         acetaminophen 500 MG tablet  Commonly known as:  TYLENOL  Take 1,000 mg by mouth 2 (two) times daily.     aspirin 81 MG tablet  Take 81 mg by mouth daily.     donepezil 10 MG tablet  Commonly known as:  ARICEPT  Take 10 mg by mouth at bedtime as needed.     furosemide 40 MG tablet  Commonly known as:  LASIX  Take 40 mg (one tablet) twice daily on MWF and take take 40 mg (one tablet) once daily  on Tu, Th, Sa, and Su     LORazepam 0.5 MG tablet  Commonly known as:  ATIVAN  Take 0.5 mg by mouth every 6 (six) hours as needed for anxiety (agitation).     memantine 10 MG tablet  Commonly known as:  NAMENDA  Take 10 mg by mouth 2 (two) times daily.     quinapril 40 MG tablet  Commonly known as:  ACCUPRIL  Take 40 mg by mouth daily.     ranitidine 75 MG tablet  Commonly known as:  ZANTAC  Take 150 mg by mouth 2 (two) times daily.     simvastatin 20 MG tablet  Commonly known as:  ZOCOR  Take 20 mg by mouth every evening.     tamsulosin 0.4 MG Caps capsule  Commonly known as:  FLOMAX  Take 0.4 mg by mouth daily.         Time coordinating discharge: 25 minutes including medication reconciliation,  preparation of discharge papers, and discussion with patient  and family (discussed with son, Onalee Hua)   Signed: Darnelle Bos 12/03/2012, 7:20 AM

## 2012-12-03 NOTE — Progress Notes (Signed)
D/c orders received. IV removed with gauze on. Pt remained in stable condition. Patient meds and instructions reviewed and given to patient and his daughter. D/c back to ALF. Transported through daughter.

## 2012-12-24 ENCOUNTER — Emergency Department (HOSPITAL_COMMUNITY)
Admission: EM | Admit: 2012-12-24 | Discharge: 2012-12-24 | Disposition: A | Discharge status: Home / Self Care | Payer: Medicare Other | Attending: Emergency Medicine | Admitting: Emergency Medicine

## 2012-12-24 ENCOUNTER — Emergency Department (HOSPITAL_COMMUNITY): Payer: Medicare Other

## 2012-12-24 DIAGNOSIS — Z79899 Other long term (current) drug therapy: Secondary | ICD-10-CM | POA: Insufficient documentation

## 2012-12-24 DIAGNOSIS — N289 Disorder of kidney and ureter, unspecified: Secondary | ICD-10-CM | POA: Insufficient documentation

## 2012-12-24 DIAGNOSIS — Y939 Activity, unspecified: Secondary | ICD-10-CM | POA: Insufficient documentation

## 2012-12-24 DIAGNOSIS — S0093XA Contusion of unspecified part of head, initial encounter: Secondary | ICD-10-CM

## 2012-12-24 DIAGNOSIS — Z87448 Personal history of other diseases of urinary system: Secondary | ICD-10-CM | POA: Insufficient documentation

## 2012-12-24 DIAGNOSIS — S0003XA Contusion of scalp, initial encounter: Secondary | ICD-10-CM | POA: Insufficient documentation

## 2012-12-24 DIAGNOSIS — Z792 Long term (current) use of antibiotics: Secondary | ICD-10-CM | POA: Insufficient documentation

## 2012-12-24 DIAGNOSIS — Y9210 Unspecified residential institution as the place of occurrence of the external cause: Secondary | ICD-10-CM | POA: Insufficient documentation

## 2012-12-24 DIAGNOSIS — W19XXXA Unspecified fall, initial encounter: Secondary | ICD-10-CM

## 2012-12-24 DIAGNOSIS — S8990XA Unspecified injury of unspecified lower leg, initial encounter: Secondary | ICD-10-CM | POA: Insufficient documentation

## 2012-12-24 DIAGNOSIS — S0190XA Unspecified open wound of unspecified part of head, initial encounter: Secondary | ICD-10-CM | POA: Insufficient documentation

## 2012-12-24 DIAGNOSIS — Z7982 Long term (current) use of aspirin: Secondary | ICD-10-CM | POA: Insufficient documentation

## 2012-12-24 DIAGNOSIS — S298XXA Other specified injuries of thorax, initial encounter: Secondary | ICD-10-CM | POA: Insufficient documentation

## 2012-12-24 DIAGNOSIS — E785 Hyperlipidemia, unspecified: Secondary | ICD-10-CM | POA: Insufficient documentation

## 2012-12-24 DIAGNOSIS — W1809XA Striking against other object with subsequent fall, initial encounter: Secondary | ICD-10-CM | POA: Insufficient documentation

## 2012-12-24 DIAGNOSIS — F0390 Unspecified dementia without behavioral disturbance: Secondary | ICD-10-CM | POA: Insufficient documentation

## 2012-12-24 LAB — CBC WITH DIFFERENTIAL/PLATELET
Basophils Absolute: 0 10*3/uL (ref 0.0–0.1)
Basophils Relative: 0 % (ref 0–1)
Eosinophils Relative: 3 % (ref 0–5)
HCT: 38 % — ABNORMAL LOW (ref 39.0–52.0)
Hemoglobin: 12.7 g/dL — ABNORMAL LOW (ref 13.0–17.0)
Lymphs Abs: 1.8 10*3/uL (ref 0.7–4.0)
MCHC: 33.4 g/dL (ref 30.0–36.0)
MCV: 92.5 fL (ref 78.0–100.0)
Monocytes Absolute: 0.7 10*3/uL (ref 0.1–1.0)
RBC: 4.11 MIL/uL — ABNORMAL LOW (ref 4.22–5.81)
RDW: 13.7 % (ref 11.5–15.5)

## 2012-12-24 LAB — TROPONIN I: Troponin I: <0.3 ng/mL (ref <0.30)

## 2012-12-24 LAB — BASIC METABOLIC PANEL
Calcium: 9.2 mg/dL (ref 8.4–10.5)
Creatinine, Ser: 1.61 mg/dL — ABNORMAL HIGH (ref 0.50–1.35)
GFR calc Af Amer: 44 mL/min — ABNORMAL LOW (ref >90)
Potassium: 4.1 mEq/L (ref 3.5–5.1)

## 2012-12-24 NOTE — ED Provider Notes (Signed)
CSN: [id number]     Arrival date & time 12/24/12  2025 History   First MD Initiated Contact with Patient 12/24/12 2105     Chief Complaint  Patient presents with  . Fall  . Chest Pain  . Knee Pain   (Consider location/radiation/quality/duration/timing/severity/associated sxs/prior Treatment) Patient is a 79 y.o. male presenting with fall, chest pain, and knee pain. The history is provided by the patient and a relative. The history is limited by the condition of the patient.  Fall Associated symptoms include chest pain.  Chest Pain Knee Pain  patient here after being found in the bathroom at his assisted living facility after striking the sink. Unknown loss of consciousness. Patient has a history of dementia. According to his daughter, who saw him earlier today, history of his baseline. She denies any recent illnesses. Patient did have a skin tear to the top of his head which is controlled with direct pressure. He does not take any blood thinners. He denies any pain to his neck at this time. EMS was called and patient transported  Past Medical History  Diagnosis Date  . Dementia   . BPH (benign prostatic hypertrophy)   . Hyperlipidemia    No past surgical history on file. No family history on file. History  Substance Use Topics  . Smoking status: Not on file  . Smokeless tobacco: Not on file  . Alcohol Use: Not on file    Review of Systems  Unable to perform ROS Cardiovascular: Positive for chest pain.    Allergies  Review of patient's allergies indicates not on file.  Home Medications   Current Outpatient Rx  Name  Route  Sig  Dispense  Refill  . acetaminophen (TYLENOL) 500 MG tablet   Oral   Take 1,000 mg by mouth 2 (two) times daily.         Marland Kitchen aspirin 81 MG tablet   Oral   Take 81 mg by mouth every morning.          . donepezil (ARICEPT) 10 MG tablet   Oral   Take 10 mg by mouth at bedtime.          Marland Kitchen doxycycline (VIBRAMYCIN) 100 MG capsule   Oral  Take 100 mg by mouth 2 (two) times daily. Started 12/20/12, for 10 days, ending 12/29/12.         . furosemide (LASIX) 40 MG tablet   Oral   Take 40 mg by mouth 2 (two) times daily.          Marland Kitchen LORazepam (ATIVAN) 0.5 MG tablet   Oral   Take 0.5 mg by mouth every 6 (six) hours as needed for anxiety (agitation).         . memantine (NAMENDA) 10 MG tablet   Oral   Take 10 mg by mouth 2 (two) times daily.         . quinapril (ACCUPRIL) 40 MG tablet   Oral   Take 40 mg by mouth every morning.          . ranitidine (ZANTAC) 75 MG tablet   Oral   Take 150 mg by mouth 2 (two) times daily.         . simvastatin (ZOCOR) 20 MG tablet   Oral   Take 20 mg by mouth every evening.         . tamsulosin (FLOMAX) 0.4 MG CAPS capsule   Oral   Take 0.4 mg by mouth daily after breakfast.  BP 131/53  Pulse 76  Temp(Src) 97.4 F (36.3 C) (Oral)  SpO2 95% Physical Exam  Nursing note and vitals reviewed. Constitutional: He is oriented to person, place, and time. He appears well-developed and well-nourished.  Non-toxic appearance. No distress.  HENT:  Head: Normocephalic and atraumatic.    Eyes: Conjunctivae, EOM and lids are normal. Pupils are equal, round, and reactive to light.  Neck: Normal range of motion. Neck supple. No tracheal deviation present. No mass present.  Cardiovascular: Normal rate, regular rhythm and normal heart sounds.  Exam reveals no gallop.   No murmur heard. Pulmonary/Chest: Effort normal and breath sounds normal. No stridor. No respiratory distress. He has no decreased breath sounds. He has no wheezes. He has no rhonchi. He has no rales.  Abdominal: Soft. Normal appearance and bowel sounds are normal. He exhibits no distension. There is no tenderness. There is no rebound and no CVA tenderness.  Musculoskeletal: Normal range of motion. He exhibits no edema and no tenderness.  Full range of motion at his lower extremities without shortening or  rotation. Nontender to palpation along the patient's thoracic and lumbar spine.  Neurological: He is alert and oriented to person, place, and time. He has normal strength. No cranial nerve deficit or sensory deficit. GCS eye subscore is 4. GCS verbal subscore is 5. GCS motor subscore is 6.  Skin: Skin is warm and dry. No abrasion and no rash noted.  Psychiatric: He has a normal mood and affect. His speech is normal and behavior is normal.    ED Course  Procedures (including critical care time) Labs Review Labs Reviewed - No data to display Imaging Review No results found.  EKG Interpretation   None       MDM  No diagnosis found. Patient with negative x-rays here . Mild renal insufficiency noted. Stable for discharge    Toy Baker, MD 12/24/12 2246

## 2012-12-24 NOTE — ED Notes (Signed)
Victorino Dike of [facility] called, report given,  and notified that Pt is enroute back

## 2012-12-24 NOTE — ED Notes (Signed)
LLE cellulitis--dtresses/wrapped today by Home Health wound RN

## 2012-12-24 NOTE — ED Notes (Addendum)
EMS called by staff of [facility] after Pt found after fall in BR. Pt has 2 cm by 2 cm hematoma to anterior crown of head that was initially bleeding profusely for EMS and is oozing mildly at this time. Pt alert and at base line with dementia per [facility] staff. Pt complaining of CP for EMS but denies CP at this time. Chief complaint is of knee and foot pain. Pt does not appear to be in any distress but is confused to date and time and keeps repeating observations of finding himself covered in blood and blood being all over bathroom. Daughter and son in law at [facility]. EMS vitals: 139/74 74 SR, 98 RA. CBG 178, and 12 lead WNL NSR.

## 2013-02-12 ENCOUNTER — Encounter (HOSPITAL_COMMUNITY): Payer: Self-pay | Admitting: Emergency Medicine

## 2013-02-12 ENCOUNTER — Emergency Department (HOSPITAL_COMMUNITY): Payer: Medicare Other

## 2013-02-12 ENCOUNTER — Emergency Department (HOSPITAL_COMMUNITY)
Admission: EM | Admit: 2013-02-12 | Discharge: 2013-02-12 | Disposition: A | Discharge status: Home / Self Care | Payer: Medicare Other | Attending: Emergency Medicine | Admitting: Emergency Medicine

## 2013-02-12 DIAGNOSIS — Z23 Encounter for immunization: Secondary | ICD-10-CM | POA: Insufficient documentation

## 2013-02-12 DIAGNOSIS — S0001XA Abrasion of scalp, initial encounter: Secondary | ICD-10-CM

## 2013-02-12 DIAGNOSIS — W19XXXA Unspecified fall, initial encounter: Secondary | ICD-10-CM | POA: Insufficient documentation

## 2013-02-12 DIAGNOSIS — Z7982 Long term (current) use of aspirin: Secondary | ICD-10-CM | POA: Insufficient documentation

## 2013-02-12 DIAGNOSIS — N289 Disorder of kidney and ureter, unspecified: Secondary | ICD-10-CM

## 2013-02-12 DIAGNOSIS — E785 Hyperlipidemia, unspecified: Secondary | ICD-10-CM | POA: Insufficient documentation

## 2013-02-12 DIAGNOSIS — N400 Enlarged prostate without lower urinary tract symptoms: Secondary | ICD-10-CM | POA: Insufficient documentation

## 2013-02-12 DIAGNOSIS — Y939 Activity, unspecified: Secondary | ICD-10-CM | POA: Insufficient documentation

## 2013-02-12 DIAGNOSIS — Z79899 Other long term (current) drug therapy: Secondary | ICD-10-CM | POA: Insufficient documentation

## 2013-02-12 DIAGNOSIS — Y9210 Unspecified residential institution as the place of occurrence of the external cause: Secondary | ICD-10-CM | POA: Insufficient documentation

## 2013-02-12 DIAGNOSIS — IMO0002 Reserved for concepts with insufficient information to code with codable children: Secondary | ICD-10-CM | POA: Insufficient documentation

## 2013-02-12 DIAGNOSIS — F0390 Unspecified dementia without behavioral disturbance: Secondary | ICD-10-CM | POA: Insufficient documentation

## 2013-02-12 LAB — CBC
HCT: 36.9 % — ABNORMAL LOW (ref 39.0–52.0)
MCHC: 33.6 g/dL (ref 30.0–36.0)
Platelets: 211 10*3/uL (ref 150–400)
RDW: 13.1 % (ref 11.5–15.5)

## 2013-02-12 LAB — BASIC METABOLIC PANEL
BUN: 28 mg/dL — ABNORMAL HIGH (ref 6–23)
Calcium: 9.1 mg/dL (ref 8.4–10.5)
GFR calc Af Amer: 53 mL/min — ABNORMAL LOW (ref >90)
GFR calc non Af Amer: 46 mL/min — ABNORMAL LOW (ref >90)
Potassium: 4 mEq/L (ref 3.5–5.1)
Sodium: 134 mEq/L — ABNORMAL LOW (ref 135–145)

## 2013-02-12 MED ORDER — TETANUS-DIPHTH-ACELL PERTUSSIS 5-2.5-18.5 LF-MCG/0.5 IM SUSP
0.5000 mL | Freq: Once | INTRAMUSCULAR | Status: AC
  Administered 2013-02-12: 0.5 mL via INTRAMUSCULAR
  Filled 2013-02-12: qty 0.5

## 2013-02-12 NOTE — ED Notes (Signed)
Bed: WA20 Expected date: 02/12/13 Expected time: 6:45 AM Means of arrival: Ambulance Comments: fall

## 2013-02-12 NOTE — ED Notes (Signed)
Per EMS report: pt from [facility]: Pt had an unwitnessed fall.  Bleeding on pt's head was controlled prior to EMS arrival.  EMS palpated back but denied any tenderness. Pt has edema has in his legs.  Pt transiently reports pain in back and knee. Pt is at his baseline but has dementia.  Pt only oriented to himself. Skin warm and dry.

## 2013-02-12 NOTE — ED Provider Notes (Addendum)
CSN: [id number]     Arrival date & time 02/12/13  7829 History   First MD Initiated Contact with Patient 02/12/13 [phone]     Chief Complaint  Patient presents with  . Fall   (Consider location/radiation/quality/duration/timing/severity/associated sxs/prior Treatment) HPI A LEVEL 5 CAVEAT PERTAINS DUE TO DEMENTIA Pt presents with c/o unwitnessed fall. Pt is not able to contribute much to the history, but per nursing facility he is at his baseline.  He has skin tear on top of his head and bleeding is controlled.   Past Medical History  Diagnosis Date  . Dementia   . BPH (benign prostatic hypertrophy)   . Hyperlipidemia    No past surgical history on file. No family history on file. History  Substance Use Topics  . Smoking status: Not on file  . Smokeless tobacco: Not on file  . Alcohol Use: No    Review of Systems UNABLE TO OBTAIN ROS DUE TO LEVEL 5 CAVEAT  Allergies  Review of patient's allergies indicates no known allergies.  Home Medications   Current Outpatient Rx  Name  Route  Sig  Dispense  Refill  . acetaminophen (TYLENOL) 500 MG tablet   Oral   Take 1,000 mg by mouth 2 (two) times daily.         Marland Kitchen aspirin 81 MG tablet   Oral   Take 81 mg by mouth every morning.          . donepezil (ARICEPT) 10 MG tablet   Oral   Take 10 mg by mouth at bedtime.          . furosemide (LASIX) 40 MG tablet   Oral   Take 40 mg by mouth 2 (two) times daily.          Marland Kitchen LORazepam (ATIVAN) 0.5 MG tablet   Oral   Take 0.5 mg by mouth every 6 (six) hours as needed for anxiety (agitation).         . memantine (NAMENDA) 10 MG tablet   Oral   Take 10 mg by mouth 2 (two) times daily.         . quinapril (ACCUPRIL) 40 MG tablet   Oral   Take 40 mg by mouth every morning.          . ranitidine (ZANTAC) 75 MG tablet   Oral   Take 150 mg by mouth 2 (two) times daily.         . simvastatin (ZOCOR) 20 MG tablet   Oral   Take 20 mg by mouth every evening.          . tamsulosin (FLOMAX) 0.4 MG CAPS capsule   Oral   Take 0.4 mg by mouth daily after breakfast.           BP 133/50  Pulse 83  Temp(Src) 97.6 F (36.4 C) (Oral)  Resp 20  SpO2 96% Vitals reviewed Physical Exam Physical Examination: General appearance - alert and oriented x 1, well appearing, and in no distress Mental status - alert, oriented to person, place, and time Eyes - pupils equal and reactive, extraocular eye movements intact Mouth - mucous membranes moist, pharynx normal without lesions Neck - supple, no significant adenopathy, mild mildine tenderness to palpation Chest - clear to auscultation, no wheezes, rales or rhonchi, symmetric air entry Heart - normal rate, regular rhythm, normal S1, S2, no murmurs, rubs, clicks or gallops Abdomen - soft, nontender, nondistended, no masses or organomegaly Back exam - full range of  motion, no midline tenderness, palpable spasm or pain on motion Musculoskeletal - no joint tenderness, deformity or swelling Extremities - peripheral pulses normal, no pedal edema, no clubbing or cyanosis Skin - normal coloration and turgor, no rashes  ED Course  Procedures (including critical care time) Labs Review Labs Reviewed  CBC - Abnormal; Notable for the following:    RBC 4.04 (*)    Hemoglobin 12.4 (*)    HCT 36.9 (*)    All other components within normal limits  BASIC METABOLIC PANEL - Abnormal; Notable for the following:    Sodium 134 (*)    Glucose, Bld 123 (*)    BUN 28 (*)    Creatinine, Ser 1.37 (*)    GFR calc non Af Amer 46 (*)    GFR calc Af Amer 53 (*)    All other components within normal limits   Imaging Review Ct Head Wo Contrast  02/12/2013   CLINICAL DATA:  Fall.  Headache, neck pain.  EXAM: CT HEAD WITHOUT CONTRAST  CT CERVICAL SPINE WITHOUT CONTRAST  TECHNIQUE: Multidetector CT imaging of the head and cervical spine was performed following the standard protocol without intravenous contrast. Multiplanar CT image  reconstructions of the cervical spine were also generated.  COMPARISON:  12/24/2012  FINDINGS: CT HEAD FINDINGS  There is atrophy and chronic small vessel disease changes. No acute intracranial abnormality. Specifically, no hemorrhage, hydrocephalus, mass lesion, acute infarction, or significant intracranial injury. No acute calvarial abnormality.  No air-fluid levels in the paranasal sinuses. Mastoid air cells are clear.  CT CERVICAL SPINE FINDINGS  Diffuse degenerative disc and facet disease throughout the cervical spine, most pronounced at C5-6 and C6-7. No fracture or subluxation. Loss of normal cervical lordosis. No epidural or paraspinal hematoma. No change since recent study.  IMPRESSION: No acute intracranial abnormality.  Atrophy, chronic microvascular disease.  Degenerative changes throughout the cervical spine, stable.  No acute bony abnormality.   Electronically Signed   By: Charlett Nose M.D.   On: 02/12/2013 08:14   Ct Cervical Spine Wo Contrast  02/12/2013   CLINICAL DATA:  Fall.  Headache, neck pain.  EXAM: CT HEAD WITHOUT CONTRAST  CT CERVICAL SPINE WITHOUT CONTRAST  TECHNIQUE: Multidetector CT imaging of the head and cervical spine was performed following the standard protocol without intravenous contrast. Multiplanar CT image reconstructions of the cervical spine were also generated.  COMPARISON:  12/24/2012  FINDINGS: CT HEAD FINDINGS  There is atrophy and chronic small vessel disease changes. No acute intracranial abnormality. Specifically, no hemorrhage, hydrocephalus, mass lesion, acute infarction, or significant intracranial injury. No acute calvarial abnormality.  No air-fluid levels in the paranasal sinuses. Mastoid air cells are clear.  CT CERVICAL SPINE FINDINGS  Diffuse degenerative disc and facet disease throughout the cervical spine, most pronounced at C5-6 and C6-7. No fracture or subluxation. Loss of normal cervical lordosis. No epidural or paraspinal hematoma. No change since  recent study.  IMPRESSION: No acute intracranial abnormality.  Atrophy, chronic microvascular disease.  Degenerative changes throughout the cervical spine, stable.  No acute bony abnormality.   Electronically Signed   By: Charlett Nose M.D.   On: 02/12/2013 08:14    EKG Interpretation   None      Date: 02/12/2013  Rate: 66  Rhythm: normal sinus rhythm  QRS Axis: normal  Intervals: normal  ST/T Wave abnormalities: normal  Conduction Disutrbances: none  Narrative Interpretation: unremarkable  EKG not available for interpretation in muse      MDM  1. Fall, initial encounter   2. Renal insufficiency   3. Abrasion of scalp, initial encounter   4. Dementia    Pt presenting with c/o unwitnessed fall at nursing home.  He has abraded skin on the top of his head, no other signs of trauma on exam.  Pt is at his baseline per staff there.  Head CT with chronic changes, labs reassuring.  Mild renal insufficiency noted, improved from prior.  Pt discharged with strict return precautions.      Ethelda Chick, MD 02/12/13 [id number]  Ethelda Chick, MD 02/12/13 [phone]

## 2013-02-14 ENCOUNTER — Encounter (HOSPITAL_COMMUNITY): Payer: Self-pay | Admitting: Emergency Medicine

## 2013-02-14 DIAGNOSIS — R609 Edema, unspecified: Secondary | ICD-10-CM | POA: Insufficient documentation

## 2013-02-14 DIAGNOSIS — Y9210 Unspecified residential institution as the place of occurrence of the external cause: Secondary | ICD-10-CM | POA: Insufficient documentation

## 2013-02-14 DIAGNOSIS — E785 Hyperlipidemia, unspecified: Secondary | ICD-10-CM | POA: Insufficient documentation

## 2013-02-14 DIAGNOSIS — Z7982 Long term (current) use of aspirin: Secondary | ICD-10-CM | POA: Insufficient documentation

## 2013-02-14 DIAGNOSIS — F0390 Unspecified dementia without behavioral disturbance: Secondary | ICD-10-CM | POA: Insufficient documentation

## 2013-02-14 DIAGNOSIS — Z79899 Other long term (current) drug therapy: Secondary | ICD-10-CM | POA: Insufficient documentation

## 2013-02-14 DIAGNOSIS — W19XXXA Unspecified fall, initial encounter: Secondary | ICD-10-CM | POA: Insufficient documentation

## 2013-02-14 DIAGNOSIS — Y939 Activity, unspecified: Secondary | ICD-10-CM | POA: Insufficient documentation

## 2013-02-14 DIAGNOSIS — N400 Enlarged prostate without lower urinary tract symptoms: Secondary | ICD-10-CM | POA: Insufficient documentation

## 2013-02-14 DIAGNOSIS — R21 Rash and other nonspecific skin eruption: Secondary | ICD-10-CM | POA: Insufficient documentation

## 2013-02-14 NOTE — ED Notes (Signed)
Per pt sts sent here by his doctor for leg swelling. Pt legs very swollen and oozing.

## 2013-02-15 ENCOUNTER — Emergency Department (HOSPITAL_COMMUNITY)
Admission: EM | Admit: 2013-02-15 | Discharge: 2013-02-15 | Disposition: A | Discharge status: Home / Self Care | Payer: Medicare Other | Attending: Emergency Medicine | Admitting: Emergency Medicine

## 2013-02-15 DIAGNOSIS — R609 Edema, unspecified: Secondary | ICD-10-CM

## 2013-02-15 LAB — CBC
HCT: 39.1 % (ref 39.0–52.0)
Hemoglobin: 13 g/dL (ref 13.0–17.0)
MCH: 30.7 pg (ref 26.0–34.0)
MCHC: 33.2 g/dL (ref 30.0–36.0)
Platelets: 210 10*3/uL (ref 150–400)
RDW: 13.4 % (ref 11.5–15.5)

## 2013-02-15 LAB — BASIC METABOLIC PANEL
BUN: 25 mg/dL — ABNORMAL HIGH (ref 6–23)
Chloride: 102 mEq/L (ref 96–112)
Creatinine, Ser: 1.24 mg/dL (ref 0.50–1.35)
GFR calc non Af Amer: 52 mL/min — ABNORMAL LOW (ref >90)
Glucose, Bld: 109 mg/dL — ABNORMAL HIGH (ref 70–99)
Potassium: 3.9 mEq/L (ref 3.5–5.1)

## 2013-02-15 MED ORDER — BACITRACIN 500 UNIT/GM EX OINT
1.0000 "application " | TOPICAL_OINTMENT | Freq: Two times a day (BID) | CUTANEOUS | Status: DC
  Filled 2013-02-15: qty 0.9

## 2013-02-15 MED ORDER — SULFAMETHOXAZOLE-TRIMETHOPRIM 800-160 MG PO TABS: 1.0000 | ORAL_TABLET | Freq: Two times a day (BID) | ORAL | Status: DC

## 2013-02-15 MED ORDER — BACITRACIN ZINC 500 UNIT/GM EX OINT
TOPICAL_OINTMENT | Freq: Two times a day (BID) | CUTANEOUS | Status: DC
  Administered 2013-02-15: 03:00:00 via TOPICAL
  Filled 2013-02-15: qty 28.35

## 2013-02-15 NOTE — ED Notes (Signed)
Dressing done to bilateral lower ext. Pt tolerated well.

## 2013-02-15 NOTE — ED Provider Notes (Signed)
CSN: [id number]     Arrival date & time 02/14/13  1834 History   First MD Initiated Contact with Patient 02/15/13 0211     Chief Complaint  Patient presents with  . Leg Swelling   (Consider location/radiation/quality/duration/timing/severity/associated sxs/prior Treatment) HPI Comments: 79 year old male with a history of dementia presents with a complaint of bilateral lower extremity swelling. The patient lives in an assisted care facility, he was told to come to the hospital for evaluation of the swelling after his son saw his legs this evening and noted that over the last couple of days the skin has been weeping clear fluid and extremely swollen. He does have a history of requiring compression wraps on his legs which did seem to help but has not had them recently. There has been no fevers or vomiting, no coughing or shortness of breath and review of the medical records shows that the patient has recently had evaluation with a BNP which was less than 300. He had a recent fall at his assisted care facility 2 days ago but had normal imaging and was discharged home. He has had no sequela of that fall and is doing well other than his swelling.  Level 5 caveat applies secondary to dementia   The history is provided by the patient.    Past Medical History  Diagnosis Date  . Dementia   . BPH (benign prostatic hypertrophy)   . Hyperlipidemia    History reviewed. No pertinent past surgical history. History reviewed. No pertinent family history. History  Substance Use Topics  . Smoking status: Never Smoker   . Smokeless tobacco: Not on file  . Alcohol Use: No    Review of Systems  All other systems reviewed and are negative.    Allergies  Review of patient's allergies indicates no known allergies.  Home Medications   Current Outpatient Rx  Name  Route  Sig  Dispense  Refill  . aspirin 81 MG tablet   Oral   Take 81 mg by mouth every morning.          . donepezil (ARICEPT) 10  MG tablet   Oral   Take 10 mg by mouth at bedtime.          . furosemide (LASIX) 40 MG tablet   Oral   Take 40 mg by mouth 2 (two) times daily.          . memantine (NAMENDA) 10 MG tablet   Oral   Take 10 mg by mouth 2 (two) times daily.         . quinapril (ACCUPRIL) 40 MG tablet   Oral   Take 40 mg by mouth every morning.          . ranitidine (ZANTAC) 75 MG tablet   Oral   Take 150 mg by mouth 2 (two) times daily.         . simvastatin (ZOCOR) 20 MG tablet   Oral   Take 20 mg by mouth every evening.         . tamsulosin (FLOMAX) 0.4 MG CAPS capsule   Oral   Take 0.4 mg by mouth daily after breakfast.          . acetaminophen (TYLENOL) 500 MG tablet   Oral   Take 1,000 mg by mouth 2 (two) times daily.         Marland Kitchen LORazepam (ATIVAN) 0.5 MG tablet   Oral   Take 0.5 mg by mouth every 6 (six) hours  as needed for anxiety (agitation).         Marland Kitchen sulfamethoxazole-trimethoprim (SEPTRA DS) 800-160 MG per tablet   Oral   Take 1 tablet by mouth every 12 (twelve) hours.   20 tablet   0    BP 142/51  Pulse 75  Temp(Src) 97.6 F (36.4 C) (Oral)  Resp 18  SpO2 95% Physical Exam  Nursing note and vitals reviewed. Constitutional: He appears well-developed and well-nourished. No distress.  HENT:  Head: Normocephalic and atraumatic.  Mouth/Throat: Oropharynx is clear and moist. No oropharyngeal exudate.  Eyes: Conjunctivae and EOM are normal. Pupils are equal, round, and reactive to light. Right eye exhibits no discharge. Left eye exhibits no discharge. No scleral icterus.  Neck: Normal range of motion. Neck supple. No JVD present. No thyromegaly present.  Cardiovascular: Normal rate, regular rhythm, normal heart sounds and intact distal pulses.  Exam reveals no gallop and no friction rub.   No murmur heard. Pulmonary/Chest: Effort normal and breath sounds normal. No respiratory distress. He has no wheezes. He has no rales.  Abdominal: Soft. Bowel sounds are  normal. He exhibits no distension and no mass. There is no tenderness.  Musculoskeletal: Normal range of motion. He exhibits edema. He exhibits no tenderness.  2+ bilateral LE edema, symmetrical, mild erythema to the medial mid lower extermity on the L.  Weeping and open wound seen - all superficial, no purulence.  Lymphadenopathy:    He has no cervical adenopathy.  Neurological: He is alert. Coordination normal.  Skin: Skin is warm and dry. Rash noted. There is erythema.  Psychiatric: He has a normal mood and affect. His behavior is normal.    ED Course  Procedures (including critical care time) Labs Review Labs Reviewed  BASIC METABOLIC PANEL - Abnormal; Notable for the following:    Glucose, Bld 109 (*)    BUN 25 (*)    GFR calc non Af Amer 52 (*)    GFR calc Af Amer 60 (*)    All other components within normal limits  CBC   Imaging Review No results found.  EKG Interpretation   None       MDM   1. Peripheral edema    The patient was examined, evaluated with lab work in the medical record was reviewed, there is no definite etiology of the patient's peripheral edema that was identified, this seems to be a chronic condition for which he is already taking Lasix. His blood pressure is normal, his renal function is preserved compared to his baseline and he should be able to tolerate an extra dose of diuretic today and tomorrow. I have recommended followup on Monday with his family doctor for wound checks, we have provided wound care here including a compression dressing prior to discharge. This was discussed with the son who is in agreement, Bactrim will be added to treat early cellulitis of the lower extremity the patient is afebrile and well-appearing.  Meds given in ED:  Medications  bacitracin ointment ( Topical Given 02/15/13 0315)    New Prescriptions   SULFAMETHOXAZOLE-TRIMETHOPRIM (SEPTRA DS) 800-160 MG PER TABLET    Take 1 tablet by mouth every 12 (twelve) hours.         Vida Roller, MD 02/15/13 [phone]

## 2013-02-15 NOTE — ED Notes (Signed)
Pt dc to home. Pt sts understanding to dc instructions. Son at bedside to discuss results of discharge paperwork. Pt taken to car via w/c.

## 2013-04-13 ENCOUNTER — Encounter (HOSPITAL_COMMUNITY): Payer: Self-pay | Admitting: Emergency Medicine

## 2013-04-13 ENCOUNTER — Emergency Department (HOSPITAL_COMMUNITY)
Admission: EM | Admit: 2013-04-13 | Discharge: 2013-04-13 | Disposition: A | Discharge status: Home / Self Care | Payer: Medicare Other | Attending: Emergency Medicine | Admitting: Emergency Medicine

## 2013-04-13 DIAGNOSIS — E785 Hyperlipidemia, unspecified: Secondary | ICD-10-CM | POA: Insufficient documentation

## 2013-04-13 DIAGNOSIS — W010XXA Fall on same level from slipping, tripping and stumbling without subsequent striking against object, initial encounter: Secondary | ICD-10-CM | POA: Insufficient documentation

## 2013-04-13 DIAGNOSIS — S1093XA Contusion of unspecified part of neck, initial encounter: Secondary | ICD-10-CM

## 2013-04-13 DIAGNOSIS — N400 Enlarged prostate without lower urinary tract symptoms: Secondary | ICD-10-CM | POA: Insufficient documentation

## 2013-04-13 DIAGNOSIS — Z7982 Long term (current) use of aspirin: Secondary | ICD-10-CM | POA: Insufficient documentation

## 2013-04-13 DIAGNOSIS — S0001XA Abrasion of scalp, initial encounter: Secondary | ICD-10-CM

## 2013-04-13 DIAGNOSIS — IMO0002 Reserved for concepts with insufficient information to code with codable children: Secondary | ICD-10-CM | POA: Insufficient documentation

## 2013-04-13 DIAGNOSIS — Z79899 Other long term (current) drug therapy: Secondary | ICD-10-CM | POA: Insufficient documentation

## 2013-04-13 DIAGNOSIS — S0003XA Contusion of scalp, initial encounter: Secondary | ICD-10-CM | POA: Insufficient documentation

## 2013-04-13 DIAGNOSIS — Y9210 Unspecified residential institution as the place of occurrence of the external cause: Secondary | ICD-10-CM | POA: Insufficient documentation

## 2013-04-13 DIAGNOSIS — S0100XA Unspecified open wound of scalp, initial encounter: Secondary | ICD-10-CM | POA: Insufficient documentation

## 2013-04-13 DIAGNOSIS — Y9389 Activity, other specified: Secondary | ICD-10-CM | POA: Insufficient documentation

## 2013-04-13 DIAGNOSIS — S0083XA Contusion of other part of head, initial encounter: Secondary | ICD-10-CM

## 2013-04-13 DIAGNOSIS — Z792 Long term (current) use of antibiotics: Secondary | ICD-10-CM | POA: Insufficient documentation

## 2013-04-13 DIAGNOSIS — F0390 Unspecified dementia without behavioral disturbance: Secondary | ICD-10-CM | POA: Insufficient documentation

## 2013-04-13 MED ORDER — BACITRACIN ZINC 500 UNIT/GM EX OINT
1.0000 "application " | TOPICAL_OINTMENT | Freq: Two times a day (BID) | CUTANEOUS | Status: DC
  Administered 2013-04-13: 1 via TOPICAL
  Filled 2013-04-13: qty 28.35

## 2013-04-13 NOTE — ED Notes (Signed)
Pt brought to ED by EMS with fall.Pt lives in a nursing care facility and have been independent.This morning he slipped on the floor as he was trying to get into bed .EMS reports no LOC.Pt with GCS 15 .

## 2013-04-13 NOTE — ED Provider Notes (Signed)
CSN: [id number]     Arrival date & time 04/13/13  1143 History   First MD Initiated Contact with Patient 04/13/13 1152     Chief Complaint  Patient presents with  . Fall     (Consider location/radiation/quality/duration/timing/severity/associated sxs/prior Treatment) Patient is a 80 y.o. male presenting with fall. The history is provided by the patient and the EMS personnel.  Fall Pertinent negatives include no chest pain, no abdominal pain, no headaches and no shortness of breath.  pt w hx dementia, from [facility], went get out of bed, slipped on floor, bumped top of head w skin tear to anterior scalp. No loc. pts mental status remained consistent with baseline, dementia.  Pt denies any c/o or pain. No faintness or dizziness. No headache. No nv. No neck or back pain. Notes mild pain localized to abrasion/skin tear, mild, dull, non radiating.  Tetanus up to date.   Past Medical History  Diagnosis Date  . Dementia   . BPH (benign prostatic hypertrophy)   . Hyperlipidemia    History reviewed. No pertinent past surgical history. No family history on file. History  Substance Use Topics  . Smoking status: Never Smoker   . Smokeless tobacco: Not on file  . Alcohol Use: No    Review of Systems  Constitutional: Negative for fever.  HENT: Negative for nosebleeds.   Eyes: Negative for visual disturbance.  Respiratory: Negative for shortness of breath.   Cardiovascular: Negative for chest pain.  Gastrointestinal: Negative for nausea, vomiting and abdominal pain.  Genitourinary: Negative for flank pain.  Musculoskeletal: Negative for back pain and neck pain.  Skin: Positive for wound.  Neurological: Negative for weakness, numbness and headaches.  Hematological: Does not bruise/bleed easily.  Psychiatric/Behavioral: Negative for agitation.      Allergies  Review of patient's allergies indicates no known allergies.  Home Medications   Current Outpatient Rx  Name  Route  Sig  Dispense   Refill  . acetaminophen (TYLENOL) 500 MG tablet   Oral   Take 1,000 mg by mouth 2 (two) times daily.         Marland Kitchen. aspirin 81 MG tablet   Oral   Take 81 mg by mouth every morning.          . donepezil (ARICEPT) 10 MG tablet   Oral   Take 10 mg by mouth at bedtime.          . furosemide (LASIX) 40 MG tablet   Oral   Take 40 mg by mouth 2 (two) times daily.          Marland Kitchen. LORazepam (ATIVAN) 0.5 MG tablet   Oral   Take 0.5 mg by mouth every 6 (six) hours as needed for anxiety (agitation).         . memantine (NAMENDA) 10 MG tablet   Oral   Take 10 mg by mouth 2 (two) times daily.         . quinapril (ACCUPRIL) 40 MG tablet   Oral   Take 40 mg by mouth every morning.          . ranitidine (ZANTAC) 75 MG tablet   Oral   Take 150 mg by mouth 2 (two) times daily.         . simvastatin (ZOCOR) 20 MG tablet   Oral   Take 20 mg by mouth every evening.         . sulfamethoxazole-trimethoprim (SEPTRA DS) 800-160 MG per tablet   Oral  Take 1 tablet by mouth every 12 (twelve) hours.   20 tablet   0   . tamsulosin (FLOMAX) 0.4 MG CAPS capsule   Oral   Take 0.4 mg by mouth daily after breakfast.           BP 128/48  Pulse 57  Temp(Src) 98.6 F (37 C) (Oral)  Resp 16  SpO2 100% Physical Exam  Nursing note and vitals reviewed. Constitutional: He appears well-developed and well-nourished. No distress.  HENT:  Small, superficial skin tear to anterior scalp. No bleeding.   Eyes: Conjunctivae are normal. Pupils are equal, round, and reactive to light.  Neck: Normal range of motion. Neck supple. No tracheal deviation present.  Cardiovascular: Normal rate, regular rhythm, normal heart sounds and intact distal pulses.   Pulmonary/Chest: Effort normal and breath sounds normal. No accessory muscle usage. No respiratory distress. He exhibits no tenderness.  Abdominal: Soft. He exhibits no distension. There is no tenderness.  Musculoskeletal: Normal range of motion.  He exhibits no tenderness.  CTLS spine, non tender, aligned, no step off.  Good rom bil ext without pain or focal bony tenderness.    Neurological: He is alert.  Awake and alert. Pleasant, conversant. Motor intact bil. sens intact. Moves bil extremities purposefully.   Skin: Skin is warm and dry.  Psychiatric: He has a normal mood and affect.    ED Course  Procedures (including critical care time) Labs Review Labs Reviewed - No data to display Imaging Review No results found.  EKG Interpretation   None       MDM  Reviewed nursing notes and prior charts for additional history.   Wound cleaned, bacitracin and dressing applied.  Tetanus up to date.  Pt without loc, no headache. Spine nt. No c/o of pain. Mental status c/w baseline.  Pt appears stable for d/c.      Suzi Roots, MD 04/13/13 1208

## 2013-04-13 NOTE — Discharge Instructions (Signed)
Keep wound very clean. Return to ER if worse, new symptoms, severe pain, vomiting, change in mental status, other concern.    Facial or Scalp Contusion A facial or scalp contusion is a deep bruise on the face or head. Injuries to the face and head generally cause a lot of swelling, especially around the eyes. Contusions are the result of an injury that caused bleeding under the skin. The contusion may turn blue, purple, or yellow. Minor injuries will give you a painless contusion, but more severe contusions may stay painful and swollen for a few weeks.  CAUSES  A facial or scalp contusion is caused by a blunt injury or trauma to the face or head area.  SIGNS AND SYMPTOMS   Swelling of the injured area.   Discoloration of the injured area.   Tenderness, soreness, or pain in the injured area.  DIAGNOSIS  The diagnosis can be made by taking a medical history and doing a physical exam. An X-ray exam, CT scan, or MRI may be needed to determine if there are any associated injuries, such as broken bones (fractures). TREATMENT  Often, the best treatment for a facial or scalp contusion is applying cold compresses to the injured area. Over-the-counter medicines may also be recommended for pain control.  HOME CARE INSTRUCTIONS   Only take over-the-counter or prescription medicines as directed by your health care provider.   Apply ice to the injured area.   Put ice in a plastic bag.   Place a towel between your skin and the bag.   Leave the ice on for 20 minutes, 2 3 times a day.  SEEK MEDICAL CARE IF:  You have bite problems.   You have pain with chewing.   You are concerned about facial defects. SEEK IMMEDIATE MEDICAL CARE IF:  You have severe pain or a headache that is not relieved by medicine.   You have unusual sleepiness, confusion, or personality changes.   You throw up (vomit).   You have a persistent nosebleed.   You have double vision or blurred vision.    You have fluid drainage from your nose or ear.   You have difficulty walking or using your arms or legs.  MAKE SURE YOU:   Understand these instructions.  Will watch your condition.  Will get help right away if you are not doing well or get worse. Document Released: 03/16/2004 Document Revised: 11/27/2012 Document Reviewed: 09/19/2012 [facility] Patient Information 2014 [place], [place].    Skin Tear Care A skin tear is a wound in which the top layer of skin has peeled off. This is a common problem with aging because the skin becomes thinner and more fragile as a person gets older. In addition, some medicines, such as oral corticosteroids, can lead to skin thinning if taken for long periods of time.  A skin tear is often repaired with tape or skin adhesive strips. This keeps the skin that has been peeled off in contact with the healthier skin beneath. Depending on the location of the wound, a bandage (dressing) may be applied over the tape or skin adhesive strips. Sometimes, during the healing process, the skin turns black and dies. Even when this happens, the torn skin acts as a good dressing until the skin underneath gets healthier and repairs itself. HOME CARE INSTRUCTIONS   Change dressings once per day or as directed by your caregiver.  Gently clean the skin tear and the area around the tear using saline solution or mild soap  and water.  Do not rub the injured skin dry. Let the area air dry.  Apply petroleum jelly or an antibiotic cream or ointment to keep the tear moist. This will help the wound heal. Do not allow a scab to form.  If the dressing sticks before the next dressing change, moisten it with warm soapy water and gently remove it.  Protect the injured skin until it has healed.  Only take over-the-counter or prescription medicines as directed by your caregiver.  Take showers or baths using warm soapy water. Apply a new dressing after the shower or bath.  Keep all  follow-up appointments as directed by your caregiver.  SEEK IMMEDIATE MEDICAL CARE IF:   You have redness, swelling, or increasing pain in the skin tear.  You havepus coming from the skin tear.  You have chills.  You have a red streak that goes away from the skin tear.  You have a bad smell coming from the tear or dressing.  You have a fever or persistent symptoms for more than 2 3 days.  You have a fever and your symptoms suddenly get worse. MAKE SURE YOU:  Understand these instructions.  Will watch this condition.  Will get help right away if your child is not doing well or gets worse. Document Released: 11/01/2000 Document Revised: 11/01/2011 Document Reviewed: 08/21/2011 [facility] Patient Information 2014 [place], [place].    Abrasion An abrasion is a cut or scrape of the skin. Abrasions do not extend through all layers of the skin and most heal within 10 days. It is important to care for your abrasion properly to prevent infection. CAUSES  Most abrasions are caused by falling on, or gliding across, the ground or other surface. When your skin rubs on something, the outer and inner layer of skin rubs off, causing an abrasion. DIAGNOSIS  Your caregiver will be able to diagnose an abrasion during a physical exam.  TREATMENT  Your treatment depends on how large and deep the abrasion is. Generally, your abrasion will be cleaned with water and a mild soap to remove any dirt or debris. An antibiotic ointment may be put over the abrasion to prevent an infection. A bandage (dressing) may be wrapped around the abrasion to keep it from getting dirty.  You may need a tetanus shot if:  You cannot remember when you had your last tetanus shot.  You have never had a tetanus shot.  The injury broke your skin. If you get a tetanus shot, your arm may swell, get red, and feel warm to the touch. This is common and not a problem. If you need a tetanus shot and you choose not to have one,  there is a rare chance of getting tetanus. Sickness from tetanus can be serious.  HOME CARE INSTRUCTIONS   If a dressing was applied, change it at least once a day or as directed by your caregiver. If the bandage sticks, soak it off with warm water.   Wash the area with water and a mild soap to remove all the ointment 2 times a day. Rinse off the soap and pat the area dry with a clean towel.   Reapply any ointment as directed by your caregiver. This will help prevent infection and keep the bandage from sticking. Use gauze over the wound and under the dressing to help keep the bandage from sticking.   Change your dressing right away if it becomes wet or dirty.   Only take over-the-counter or prescription medicines  for pain, discomfort, or fever as directed by your caregiver.   Follow up with your caregiver within 24 48 hours for a wound check, or as directed. If you were not given a wound-check appointment, look closely at your abrasion for redness, swelling, or pus. These are signs of infection. SEEK IMMEDIATE MEDICAL CARE IF:   You have increasing pain in the wound.   You have redness, swelling, or tenderness around the wound.   You have pus coming from the wound.   You have a fever or persistent symptoms for more than 2 3 days.  You have a fever and your symptoms suddenly get worse.  You have a bad smell coming from the wound or dressing.  MAKE SURE YOU:   Understand these instructions.  Will watch your condition.  Will get help right away if you are not doing well or get worse. Document Released: 11/16/2004 Document Revised: 01/24/2012 Document Reviewed: 01/10/2011 [facility] Patient Information 2014 [place], [place].

## 2013-07-15 ENCOUNTER — Encounter (HOSPITAL_COMMUNITY): Payer: Self-pay | Admitting: Emergency Medicine

## 2013-07-15 ENCOUNTER — Emergency Department (HOSPITAL_COMMUNITY): Payer: Medicare Other

## 2013-07-15 ENCOUNTER — Emergency Department (HOSPITAL_COMMUNITY)
Admission: EM | Admit: 2013-07-15 | Discharge: 2013-07-15 | Disposition: A | Discharge status: Home / Self Care | Payer: Medicare Other | Attending: Emergency Medicine | Admitting: Emergency Medicine

## 2013-07-15 DIAGNOSIS — Z792 Long term (current) use of antibiotics: Secondary | ICD-10-CM | POA: Insufficient documentation

## 2013-07-15 DIAGNOSIS — Z79899 Other long term (current) drug therapy: Secondary | ICD-10-CM | POA: Insufficient documentation

## 2013-07-15 DIAGNOSIS — E785 Hyperlipidemia, unspecified: Secondary | ICD-10-CM | POA: Insufficient documentation

## 2013-07-15 DIAGNOSIS — Y9389 Activity, other specified: Secondary | ICD-10-CM | POA: Insufficient documentation

## 2013-07-15 DIAGNOSIS — T148XXA Other injury of unspecified body region, initial encounter: Secondary | ICD-10-CM

## 2013-07-15 DIAGNOSIS — Y929 Unspecified place or not applicable: Secondary | ICD-10-CM | POA: Insufficient documentation

## 2013-07-15 DIAGNOSIS — F0390 Unspecified dementia without behavioral disturbance: Secondary | ICD-10-CM | POA: Insufficient documentation

## 2013-07-15 DIAGNOSIS — R296 Repeated falls: Secondary | ICD-10-CM | POA: Insufficient documentation

## 2013-07-15 DIAGNOSIS — W19XXXA Unspecified fall, initial encounter: Secondary | ICD-10-CM

## 2013-07-15 DIAGNOSIS — S20229A Contusion of unspecified back wall of thorax, initial encounter: Secondary | ICD-10-CM | POA: Insufficient documentation

## 2013-07-15 DIAGNOSIS — Z87448 Personal history of other diseases of urinary system: Secondary | ICD-10-CM | POA: Insufficient documentation

## 2013-07-15 LAB — URINALYSIS, ROUTINE W REFLEX MICROSCOPIC
Bilirubin Urine: NEGATIVE
Glucose, UA: NEGATIVE mg/dL
Hgb urine dipstick: NEGATIVE
Ketones, ur: NEGATIVE mg/dL
LEUKOCYTES UA: NEGATIVE
Nitrite: NEGATIVE
PH: 6.5 (ref 5.0–8.0)
Protein, ur: NEGATIVE mg/dL
SPECIFIC GRAVITY, URINE: 1.015 (ref 1.005–1.030)
Urobilinogen, UA: 0.2 mg/dL (ref 0.0–1.0)

## 2013-07-15 NOTE — ED Notes (Signed)
Bed: MV78 Expected date: 07/15/13 Expected time: 3:39 AM Means of arrival: Ambulance Comments: 80 yo M  Fall midback pain

## 2013-07-15 NOTE — ED Provider Notes (Signed)
CSN: [id number]     Arrival date & time 07/15/13  0353 History   First MD Initiated Contact with Patient 07/15/13 0431     Chief Complaint  Patient presents with  . Fall  . Back Pain     (Consider location/radiation/quality/duration/timing/severity/associated sxs/prior Treatment) HPI Comments: LEVEL 5 CAVEAT FOR DEMENTIA. Pt with dementia, alert to self only, comes in with cc of fall. Per EMS, pt fell in his room. Pt c/o back pain - mid back. Pt is demented, and poor historian. He denies headaches.  Patient is a 80 y.o. male presenting with fall and back pain. The history is provided by medical records and the EMS personnel.  Fall  Back Pain   Past Medical History  Diagnosis Date  . Dementia   . BPH (benign prostatic hypertrophy)   . Hyperlipidemia    History reviewed. No pertinent past surgical history. History reviewed. No pertinent family history. History  Substance Use Topics  . Smoking status: Never Smoker   . Smokeless tobacco: Not on file  . Alcohol Use: No    Review of Systems  Unable to perform ROS: Dementia  Musculoskeletal: Positive for back pain.      Allergies  Review of patient's allergies indicates no known allergies.  Home Medications   Prior to Admission medications   Medication Sig Start Date End Date Taking? Authorizing Provider  acetaminophen (TYLENOL) 500 MG tablet Take 1,000 mg by mouth 2 (two) times daily.   Yes Historical Provider, MD  cefUROXime (CEFTIN) 250 MG tablet Take 250 mg by mouth 2 (two) times daily with a meal. Started 06/17/2013    Last dose 06/24/13   Yes Historical Provider, MD  donepezil (ARICEPT) 10 MG tablet Take 10 mg by mouth at bedtime.    Yes Historical Provider, MD  furosemide (LASIX) 40 MG tablet Take 40 mg by mouth 2 (two) times daily.    Yes Historical Provider, MD  LORazepam (ATIVAN) 0.5 MG tablet Take 0.5 mg by mouth every 6 (six) hours as needed for anxiety (agitation).   Yes Historical Provider, MD  memantine  (NAMENDA) 10 MG tablet Take 10 mg by mouth 2 (two) times daily.   Yes Historical Provider, MD  mupirocin cream (BACTROBAN) 2 % Apply 1 application topically daily. Apply to right upper thigh cover with dressing until healed   Last used  And d/c 06/25/2013   Yes Historical Provider, MD  quinapril (ACCUPRIL) 40 MG tablet Take 40 mg by mouth every morning.    Yes Historical Provider, MD  ranitidine (ZANTAC) 75 MG tablet Take 150 mg by mouth 2 (two) times daily.   Yes Historical Provider, MD  simvastatin (ZOCOR) 20 MG tablet Take 20 mg by mouth every evening.   Yes Historical Provider, MD   BP 146/61  Pulse 78  Temp(Src) 98.3 F (36.8 C) (Oral)  Resp 16  SpO2 98% Physical Exam  Nursing note and vitals reviewed. Constitutional: He appears well-developed.  Eyes: Conjunctivae are normal.  Neck: Neck supple.  Cardiovascular: Normal rate and regular rhythm.   Pulmonary/Chest: Effort normal.  Abdominal: Soft.  Musculoskeletal:  Head to toe evaluation shows no hematoma, bleeding of the scalp, no facial abrasions, step offs, crepitus, no tenderness to palpation of the bilateral upper and lower extremities, no gross deformities, no chest tenderness, no pelvic pain.  + MIDSPINE TENDERNESS    ED Course  Procedures (including critical care time) Labs Review Labs Reviewed  URINALYSIS, ROUTINE W REFLEX MICROSCOPIC    Imaging Review  Dg Thoracic Spine 2 View  07/15/2013   CLINICAL DATA:  Found on floor.  Mid back pain.  EXAM: THORACIC SPINE - 2 VIEW  COMPARISON:  Chest radiograph performed 12/01/2012  FINDINGS: There is no evidence of fracture or subluxation. Vertebral bodies demonstrate normal height and alignment. Intervertebral disc spaces are preserved.  The visualized portions of both lungs are clear. The mediastinum is unremarkable in appearance.  IMPRESSION: No evidence of fracture or subluxation along the thoracic spine.   Electronically Signed   By: Roanna RaiderJeffery  Chang M.D.   On: 07/15/2013 06:35    Dg Lumbar Spine Complete  07/15/2013   CLINICAL DATA:  Found on floor.  Lower back pain.  EXAM: LUMBAR SPINE - COMPLETE 4+ VIEW  COMPARISON:  None.  FINDINGS: There is no evidence of acute fracture or subluxation. Vertebral bodies demonstrate normal height. There is grade 1 anterolisthesis of L4 on L5, reflecting underlying facet disease, and narrowing of the intervertebral disc space at L5-S1.  The visualized bowel gas pattern is unremarkable in appearance; air and stool are noted within the colon. Mild sclerotic change is noted at the sacroiliac joints. A mesh is seen at the lower pelvis.  IMPRESSION: 1. No evidence of acute fracture or subluxation along the lumbar spine. 2. Mild degenerative change noted at the lower lumbar spine.   Electronically Signed   By: Roanna RaiderJeffery  Chang M.D.   On: 07/15/2013 06:36   Ct Head Wo Contrast  07/15/2013   CLINICAL DATA:  Fall, no known head injury.  EXAM: CT HEAD WITHOUT CONTRAST  TECHNIQUE: Contiguous axial images were obtained from the base of the skull through the vertex without intravenous contrast.  COMPARISON:  CT HEAD W/O CM dated 02/12/2013  FINDINGS: Mild motion degraded examination. Moderate to severe right greater than left lateral ventricle ventriculomegaly, likely on the basis of global parenchymal brain volume loss as there is overall commensurate enlargement of cerebral sulci and cerebellar folia, unchanged.  No intraparenchymal hemorrhage, mass effect nor midline shift. Patchy supratentorial white matter hypodensities are within normal range for patient's age and though non-specific suggest sequelae of chronic small vessel ischemic disease. No acute large vascular territory infarcts.  No abnormal extra-axial fluid collections. Basal cisterns are patent. Mild calcific atherosclerosis of the carotid siphons.  No skull fracture. The included ocular globes and orbital contents are non-suspicious. Trace paranasal sinus mucosal thickening without air-fluid  levels. The visualized mastoid air cells are well aerated.  IMPRESSION: Mild motion degraded examination without acute intracranial process.  Stable appearance of the head: Moderate to severe global brain atrophy with moderate white matter changes suggesting chronic small vessel ischemic disease.   Electronically Signed   By: Awilda Metroourtnay  Bloomer   On: 07/15/2013 06:19     EKG Interpretation None      MDM   Final diagnoses:  Fall  Contusion    DDx includes: - Mechanical falls - ICH - Fractures - Contusions - Soft tissue injury   Pt with cc of fall. Xray back and CT head are normal. Pt ambulated. Stable for discharge.   Derwood KaplanAnkit Doug Bucklin, MD 07/15/13 [phone]

## 2013-07-15 NOTE — ED Notes (Signed)
Brought in by EMS from [facility] with c/o mid-back pain.  Per EMS, pt reported that he fell in his room and has had immediate pain to middle back after the fall.  Pt presents to ED A/Ox2, moves all extremities adequately; skin warm, dry and intact.

## 2013-07-15 NOTE — Discharge Instructions (Signed)
We saw you in the ER after you had a fall. All the imaging results are normal, no fractures seen. No evidence of brain bleed. Please be very careful with walking, and do everything possible to prevent falls.   Contusion A contusion is a deep bruise. Contusions are the result of an injury that caused bleeding under the skin. The contusion may turn blue, purple, or yellow. Minor injuries will give you a painless contusion, but more severe contusions may stay painful and swollen for a few weeks.  CAUSES  A contusion is usually caused by a blow, trauma, or direct force to an area of the body. SYMPTOMS   Swelling and redness of the injured area.  Bruising of the injured area.  Tenderness and soreness of the injured area.  Pain. DIAGNOSIS  The diagnosis can be made by taking a history and physical exam. An X-ray, CT scan, or MRI may be needed to determine if there were any associated injuries, such as fractures. TREATMENT  Specific treatment will depend on what area of the body was injured. In general, the best treatment for a contusion is resting, icing, elevating, and applying cold compresses to the injured area. Over-the-counter medicines may also be recommended for pain control. Ask your caregiver what the best treatment is for your contusion. HOME CARE INSTRUCTIONS   Put ice on the injured area.  Put ice in a plastic bag.  Place a towel between your skin and the bag.  Leave the ice on for 15-20 minutes, 03-04 times a day.  Only take over-the-counter or prescription medicines for pain, discomfort, or fever as directed by your caregiver. Your caregiver may recommend avoiding anti-inflammatory medicines (aspirin, ibuprofen, and naproxen) for 48 hours because these medicines may increase bruising.  Rest the injured area.  If possible, elevate the injured area to reduce swelling. SEEK IMMEDIATE MEDICAL CARE IF:   You have increased bruising or swelling.  You have pain that is  getting worse.  Your swelling or pain is not relieved with medicines. MAKE SURE YOU:   Understand these instructions.  Will watch your condition.  Will get help right away if you are not doing well or get worse. Document Released: 11/16/2004 Document Revised: 05/01/2011 Document Reviewed: 12/12/2010 [facility] Patient Information 2014 [place], [place].  Fall Prevention and Home Safety Falls cause injuries and can affect all age groups. It is possible to use preventive measures to significantly decrease the likelihood of falls. There are many simple measures which can make your home safer and prevent falls. OUTDOORS  Repair cracks and edges of walkways and driveways.  Remove high doorway thresholds.  Trim shrubbery on the main path into your home.  Have good outside lighting.  Clear walkways of tools, rocks, debris, and clutter.  Check that handrails are not broken and are securely fastened. Both sides of steps should have handrails.  Have leaves, snow, and ice cleared regularly.  Use sand or salt on walkways during winter months.  In the garage, clean up grease or oil spills. BATHROOM  Install night lights.  Install grab bars by the toilet and in the tub and shower.  Use non-skid mats or decals in the tub or shower.  Place a plastic non-slip stool in the shower to sit on, if needed.  Keep floors dry and clean up all water on the floor immediately.  Remove soap buildup in the tub or shower on a regular basis.  Secure bath mats with non-slip, double-sided rug tape.  Remove throw  rugs and tripping hazards from the floors. BEDROOMS  Install night lights.  Make sure a bedside light is easy to reach.  Do not use oversized bedding.  Keep a telephone by your bedside.  Have a firm chair with side arms to use for getting dressed.  Remove throw rugs and tripping hazards from the floor. KITCHEN  Keep handles on pots and pans turned toward the center of the stove.  Use back burners when possible.  Clean up spills quickly and allow time for drying.  Avoid walking on wet floors.  Avoid hot utensils and knives.  Position shelves so they are not too high or low.  Place commonly used objects within easy reach.  If necessary, use a sturdy step stool with a grab bar when reaching.  Keep electrical cables out of the way.  Do not use floor polish or wax that makes floors slippery. If you must use wax, use non-skid floor wax.  Remove throw rugs and tripping hazards from the floor. STAIRWAYS  Never leave objects on stairs.  Place handrails on both sides of stairways and use them. Fix any loose handrails. Make sure handrails on both sides of the stairways are as long as the stairs.  Check carpeting to make sure it is firmly attached along stairs. Make repairs to worn or loose carpet promptly.  Avoid placing throw rugs at the top or bottom of stairways, or properly secure the rug with carpet tape to prevent slippage. Get rid of throw rugs, if possible.  Have an electrician put in a light switch at the top and bottom of the stairs. OTHER FALL PREVENTION TIPS  Wear low-heel or rubber-soled shoes that are supportive and fit well. Wear closed toe shoes.  When using a stepladder, make sure it is fully opened and both spreaders are firmly locked. Do not climb a closed stepladder.  Add color or contrast paint or tape to grab bars and handrails in your home. Place contrasting color strips on first and last steps.  Learn and use mobility aids as needed. Install an electrical emergency response system.  Turn on lights to avoid dark areas. Replace light bulbs that burn out immediately. Get light switches that glow.  Arrange furniture to create clear pathways. Keep furniture in the same place.  Firmly attach carpet with non-skid or double-sided tape.  Eliminate uneven floor surfaces.  Select a carpet pattern that does not visually hide the edge of  steps.  Be aware of all pets. OTHER HOME SAFETY TIPS  Set the water temperature for 120 F (48.8 C).  Keep emergency numbers on or near the telephone.  Keep smoke detectors on every level of the home and near sleeping areas. Document Released: 01/27/2002 Document Revised: 08/08/2011 Document Reviewed: 04/28/2011 [facility] Patient Information 2014 [place], [place].

## 2013-12-29 ENCOUNTER — Emergency Department (HOSPITAL_COMMUNITY): Payer: Medicare Other

## 2013-12-29 ENCOUNTER — Inpatient Hospital Stay (HOSPITAL_COMMUNITY)
Admission: EM | Admit: 2013-12-29 | Discharge: 2014-01-02 | DRG: 871 | Disposition: A | Discharge status: Skilled Nursing Facility | Payer: Medicare Other | Attending: Internal Medicine | Admitting: Internal Medicine

## 2013-12-29 ENCOUNTER — Encounter (HOSPITAL_COMMUNITY): Payer: Self-pay | Admitting: Neurology

## 2013-12-29 DIAGNOSIS — R059 Cough, unspecified: Secondary | ICD-10-CM

## 2013-12-29 DIAGNOSIS — R338 Other retention of urine: Secondary | ICD-10-CM | POA: Diagnosis present

## 2013-12-29 DIAGNOSIS — E785 Hyperlipidemia, unspecified: Secondary | ICD-10-CM | POA: Diagnosis present

## 2013-12-29 DIAGNOSIS — B9620 Unspecified Escherichia coli [E. coli] as the cause of diseases classified elsewhere: Secondary | ICD-10-CM | POA: Diagnosis present

## 2013-12-29 DIAGNOSIS — Z66 Do not resuscitate: Secondary | ICD-10-CM | POA: Diagnosis present

## 2013-12-29 DIAGNOSIS — N401 Enlarged prostate with lower urinary tract symptoms: Secondary | ICD-10-CM | POA: Diagnosis present

## 2013-12-29 DIAGNOSIS — B9689 Other specified bacterial agents as the cause of diseases classified elsewhere: Secondary | ICD-10-CM | POA: Diagnosis present

## 2013-12-29 DIAGNOSIS — T501X5A Adverse effect of loop [high-ceiling] diuretics, initial encounter: Secondary | ICD-10-CM | POA: Diagnosis present

## 2013-12-29 DIAGNOSIS — M25561 Pain in right knee: Secondary | ICD-10-CM | POA: Diagnosis present

## 2013-12-29 DIAGNOSIS — R0602 Shortness of breath: Secondary | ICD-10-CM

## 2013-12-29 DIAGNOSIS — N179 Acute kidney failure, unspecified: Secondary | ICD-10-CM | POA: Diagnosis present

## 2013-12-29 DIAGNOSIS — R1310 Dysphagia, unspecified: Secondary | ICD-10-CM | POA: Diagnosis present

## 2013-12-29 DIAGNOSIS — N183 Chronic kidney disease, stage 3 (moderate): Secondary | ICD-10-CM | POA: Diagnosis present

## 2013-12-29 DIAGNOSIS — R739 Hyperglycemia, unspecified: Secondary | ICD-10-CM | POA: Diagnosis present

## 2013-12-29 DIAGNOSIS — A419 Sepsis, unspecified organism: Principal | ICD-10-CM | POA: Diagnosis present

## 2013-12-29 DIAGNOSIS — J690 Pneumonitis due to inhalation of food and vomit: Secondary | ICD-10-CM | POA: Diagnosis present

## 2013-12-29 DIAGNOSIS — R05 Cough: Secondary | ICD-10-CM | POA: Diagnosis present

## 2013-12-29 DIAGNOSIS — G9349 Other encephalopathy: Secondary | ICD-10-CM | POA: Diagnosis present

## 2013-12-29 DIAGNOSIS — N390 Urinary tract infection, site not specified: Secondary | ICD-10-CM | POA: Diagnosis present

## 2013-12-29 DIAGNOSIS — R509 Fever, unspecified: Secondary | ICD-10-CM

## 2013-12-29 DIAGNOSIS — R0600 Dyspnea, unspecified: Secondary | ICD-10-CM

## 2013-12-29 DIAGNOSIS — G309 Alzheimer's disease, unspecified: Secondary | ICD-10-CM | POA: Diagnosis present

## 2013-12-29 DIAGNOSIS — R600 Localized edema: Secondary | ICD-10-CM | POA: Diagnosis present

## 2013-12-29 DIAGNOSIS — E860 Dehydration: Secondary | ICD-10-CM | POA: Diagnosis present

## 2013-12-29 DIAGNOSIS — F0280 Dementia in other diseases classified elsewhere without behavioral disturbance: Secondary | ICD-10-CM | POA: Diagnosis present

## 2013-12-29 DIAGNOSIS — Z79899 Other long term (current) drug therapy: Secondary | ICD-10-CM | POA: Diagnosis not present

## 2013-12-29 DIAGNOSIS — M25569 Pain in unspecified knee: Secondary | ICD-10-CM

## 2013-12-29 DIAGNOSIS — R7881 Bacteremia: Secondary | ICD-10-CM

## 2013-12-29 DIAGNOSIS — R609 Edema, unspecified: Secondary | ICD-10-CM | POA: Diagnosis present

## 2013-12-29 DIAGNOSIS — N3000 Acute cystitis without hematuria: Secondary | ICD-10-CM

## 2013-12-29 DIAGNOSIS — F0390 Unspecified dementia without behavioral disturbance: Secondary | ICD-10-CM | POA: Diagnosis present

## 2013-12-29 LAB — CBC WITH DIFFERENTIAL/PLATELET
BASOS PCT: 0 % (ref 0–1)
Basophils Absolute: 0 10*3/uL (ref 0.0–0.1)
Eosinophils Absolute: 0 10*3/uL (ref 0.0–0.7)
Eosinophils Relative: 0 % (ref 0–5)
HCT: 44.8 % (ref 39.0–52.0)
HEMOGLOBIN: 14.2 g/dL (ref 13.0–17.0)
LYMPHS ABS: 0.3 10*3/uL — AB (ref 0.7–4.0)
Lymphocytes Relative: 2 % — ABNORMAL LOW (ref 12–46)
MCH: 29.2 pg (ref 26.0–34.0)
MCHC: 31.7 g/dL (ref 30.0–36.0)
MCV: 92.2 fL (ref 78.0–100.0)
Monocytes Absolute: 0.1 10*3/uL (ref 0.1–1.0)
Monocytes Relative: 1 % — ABNORMAL LOW (ref 3–12)
NEUTROS PCT: 97 % — AB (ref 43–77)
Neutro Abs: 14.5 10*3/uL — ABNORMAL HIGH (ref 1.7–7.7)
PLATELETS: 201 10*3/uL (ref 150–400)
RBC: 4.86 MIL/uL (ref 4.22–5.81)
RDW: 13.9 % (ref 11.5–15.5)
WBC: 14.9 10*3/uL — ABNORMAL HIGH (ref 4.0–10.5)

## 2013-12-29 LAB — URINE MICROSCOPIC-ADD ON

## 2013-12-29 LAB — URINALYSIS, ROUTINE W REFLEX MICROSCOPIC
BILIRUBIN URINE: NEGATIVE
GLUCOSE, UA: 250 mg/dL — AB
Ketones, ur: NEGATIVE mg/dL
Nitrite: NEGATIVE
PH: 5 (ref 5.0–8.0)
Protein, ur: NEGATIVE mg/dL
SPECIFIC GRAVITY, URINE: 1.014 (ref 1.005–1.030)
UROBILINOGEN UA: 0.2 mg/dL (ref 0.0–1.0)

## 2013-12-29 LAB — COMPREHENSIVE METABOLIC PANEL
ALBUMIN: 2.9 g/dL — AB (ref 3.5–5.2)
ALK PHOS: 72 U/L (ref 39–117)
ALT: 24 U/L (ref 0–53)
ANION GAP: 14 (ref 5–15)
AST: 19 U/L (ref 0–37)
BILIRUBIN TOTAL: 0.4 mg/dL (ref 0.3–1.2)
BUN: 30 mg/dL — ABNORMAL HIGH (ref 6–23)
CO2: 26 mEq/L (ref 19–32)
Calcium: 9.3 mg/dL (ref 8.4–10.5)
Chloride: 105 mEq/L (ref 96–112)
Creatinine, Ser: 1.41 mg/dL — ABNORMAL HIGH (ref 0.50–1.35)
GFR calc Af Amer: 51 mL/min — ABNORMAL LOW (ref >90)
GFR calc non Af Amer: 44 mL/min — ABNORMAL LOW (ref >90)
Glucose, Bld: 255 mg/dL — ABNORMAL HIGH (ref 70–99)
POTASSIUM: 4.1 meq/L (ref 3.7–5.3)
SODIUM: 145 meq/L (ref 137–147)
TOTAL PROTEIN: 7.3 g/dL (ref 6.0–8.3)

## 2013-12-29 LAB — PROCALCITONIN: Procalcitonin: 1.21 ng/mL

## 2013-12-29 LAB — MRSA PCR SCREENING: MRSA by PCR: NEGATIVE

## 2013-12-29 LAB — I-STAT CG4 LACTIC ACID, ED
Lactic Acid, Venous: 2.16 mmol/L (ref 0.5–2.2)
Lactic Acid, Venous: 1.59 mmol/L (ref 0.5–2.2)

## 2013-12-29 MED ORDER — DEXTROSE 5 % IV SOLN
1.0000 g | INTRAVENOUS | Status: DC
  Administered 2013-12-30: 1 g via INTRAVENOUS
  Filled 2013-12-29: qty 10

## 2013-12-29 MED ORDER — ACETAMINOPHEN 650 MG RE SUPP: 650.0000 mg | Freq: Four times a day (QID) | RECTAL | Status: DC | PRN

## 2013-12-29 MED ORDER — DEXTROSE 5 % IV SOLN
1.0000 g | INTRAVENOUS | Status: DC
  Filled 2013-12-29: qty 10

## 2013-12-29 MED ORDER — ACETAMINOPHEN 325 MG PO TABS
650.0000 mg | ORAL_TABLET | Freq: Once | ORAL | Status: AC
  Administered 2013-12-29: 650 mg via ORAL
  Filled 2013-12-29: qty 2

## 2013-12-29 MED ORDER — LORAZEPAM 0.5 MG PO TABS
0.5000 mg | ORAL_TABLET | Freq: Four times a day (QID) | ORAL | Status: DC | PRN
  Administered 2013-12-29: 0.5 mg via ORAL
  Filled 2013-12-29: qty 1

## 2013-12-29 MED ORDER — DONEPEZIL HCL 5 MG PO TABS
5.0000 mg | ORAL_TABLET | Freq: Every day | ORAL | Status: DC
  Administered 2013-12-29 – 2014-01-02 (×5): 5 mg via ORAL
  Filled 2013-12-29 (×5): qty 1

## 2013-12-29 MED ORDER — SODIUM CHLORIDE 0.9 % IV BOLUS (SEPSIS)
500.0000 mL | Freq: Once | INTRAVENOUS | Status: AC
  Administered 2013-12-29: 500 mL via INTRAVENOUS

## 2013-12-29 MED ORDER — LORAZEPAM 2 MG/ML IJ SOLN: 0.5000 mg | Freq: Every evening | INTRAMUSCULAR | Status: DC | PRN

## 2013-12-29 MED ORDER — FAMOTIDINE 20 MG PO TABS
20.0000 mg | ORAL_TABLET | Freq: Every day | ORAL | Status: DC
  Administered 2013-12-30 – 2013-12-31 (×2): 20 mg via ORAL
  Filled 2013-12-29 (×2): qty 1

## 2013-12-29 MED ORDER — SODIUM CHLORIDE 0.9 % IJ SOLN
3.0000 mL | Freq: Two times a day (BID) | INTRAMUSCULAR | Status: DC
  Administered 2013-12-29 – 2014-01-01 (×6): 3 mL via INTRAVENOUS

## 2013-12-29 MED ORDER — HEPARIN SODIUM (PORCINE) 5000 UNIT/ML IJ SOLN: 5000.0000 [IU] | Freq: Three times a day (TID) | INTRAMUSCULAR | Status: DC

## 2013-12-29 MED ORDER — DEXTROSE 5 % IV SOLN
1.0000 g | Freq: Once | INTRAVENOUS | Status: AC
  Administered 2013-12-29: 1 g via INTRAVENOUS
  Filled 2013-12-29: qty 10

## 2013-12-29 MED ORDER — HEPARIN SODIUM (PORCINE) 5000 UNIT/ML IJ SOLN
5000.0000 [IU] | Freq: Three times a day (TID) | INTRAMUSCULAR | Status: DC
  Administered 2013-12-29 – 2014-01-02 (×12): 5000 [IU] via SUBCUTANEOUS
  Filled 2013-12-29 (×14): qty 1

## 2013-12-29 MED ORDER — SODIUM CHLORIDE 0.9 % IV SOLN: 250.0000 mL | INTRAVENOUS | Status: DC | PRN

## 2013-12-29 MED ORDER — FAMOTIDINE 20 MG PO TABS: 20.0000 mg | ORAL_TABLET | Freq: Every day | ORAL | Status: DC

## 2013-12-29 MED ORDER — ONDANSETRON HCL 4 MG PO TABS: 4.0000 mg | ORAL_TABLET | Freq: Four times a day (QID) | ORAL | Status: DC | PRN

## 2013-12-29 MED ORDER — ACETAMINOPHEN 325 MG PO TABS: 650.0000 mg | ORAL_TABLET | Freq: Four times a day (QID) | ORAL | Status: DC | PRN

## 2013-12-29 MED ORDER — SODIUM CHLORIDE 0.9 % IJ SOLN: 3.0000 mL | INTRAMUSCULAR | Status: DC | PRN

## 2013-12-29 MED ORDER — ONDANSETRON HCL 4 MG/2ML IJ SOLN: 4.0000 mg | Freq: Four times a day (QID) | INTRAMUSCULAR | Status: DC | PRN

## 2013-12-29 NOTE — ED Notes (Signed)
Per EMS- Pt comes from [facility] unit; reports generalized weakness since this morning 4 hours ago after eating; no specific deficits; hx of dementia is at baseline now. Able to to state location, DOB, denies pain, HR 110 PVCs. 93-95% RA. Staff reports temp of 101.

## 2013-12-29 NOTE — Progress Notes (Signed)
12/29/2013 1830 UR completed. Chart reviewed completed. Isidoro DonningAlesia Arlie Riker RN CCM Case Mgmt phone [phone]

## 2013-12-29 NOTE — ED Notes (Signed)
Lactic acid results given to Dr. Campos 

## 2013-12-29 NOTE — H&P (Signed)
[facility] History and Physical  Brett Mckee WUJ:[id number] DOB: Mar 31, 1926 DOA: 12/29/2013   PCP: PCP at [facility]- daughter cannot recall name   Chief Complaint:  Sent for confsion  HPI: Brett Mckee is a 80 y.o. male with Alzheimer's dementia, BPH and hyperlipidemia who is sent from the nursing facility for confusion which per daughter began yesterday. He is found to have a fever and a UTI in the ER. Per daughter, other than being more confused, he has been able to perform his usual activities. He has never had a UTI in the past and is not currently on treatment for BPH. The patient is pleasantly confused and has no complaints.   ROS- unable to obtain due to confusion but daughter does state he has trouble with chronic pedal edema.   Past Medical History  Diagnosis Date  . Dementia   . BPH (benign prostatic hypertrophy)   . Hyperlipidemia     History reviewed. No pertinent past surgical history.  Social History: does not drink alcohol or smoke.  Lives at a memory care facility    No Known Allergies  Family history Stomach cancer in his father    Prior to Admission medications   Medication Sig Start Date End Date Taking? Authorizing Provider  acetaminophen (TYLENOL) 500 MG tablet Take 1,000 mg by mouth 2 (two) times daily.   Yes Historical Provider, MD  donepezil (ARICEPT) 10 MG tablet Take 5 mg by mouth daily.    Yes Historical Provider, MD  furosemide (LASIX) 40 MG tablet Take 40 mg by mouth daily.    Yes Historical Provider, MD  LORazepam (ATIVAN) 0.5 MG tablet Take 0.5 mg by mouth every 6 (six) hours as needed (for anxiety / agitation).    Yes Historical Provider, MD  Melatonin 3 MG TABS Take 3 mg by mouth at bedtime.   Yes Historical Provider, MD  potassium chloride SA (K-DUR,KLOR-CON) 20 MEQ tablet Take 20 mEq by mouth daily.   Yes Historical Provider, MD  ranitidine (ZANTAC) 75 MG tablet Take 150 mg by mouth daily.     Yes Historical Provider, MD  Skin Protectants, Misc. (BAZA PROTECT EX) Apply 1 application topically as needed (Apply to affected area after each incontinent episode. Notify MD if symptoms persist over a week.).   Yes Historical Provider, MD     Physical Exam: Filed Vitals:   12/29/13 1604 12/29/13 1654 12/29/13 1730 12/29/13 1753  BP: 120/55 106/28 131/56 140/53  Pulse: 104  90 92  Temp: 98.9 F (37.2 C) 98.2 F (36.8 C)  98.4 F (36.9 C)  TempSrc: Oral Oral  Oral  Resp: 24 26 31 28   Height:    6' (1.829 m)  Weight:    100.109 kg (220 lb 11.2 oz)  SpO2: 93% 93% 92% 94%     General: Awake, alert, disoriented to time and place.  HEENT: Normocephalic and Atraumatic, Mucous membranes pink                PERRLA; EOM intact; No scleral icterus,                 Nares: Patent, Oropharynx: Clear, Fair Dentition                 Neck: FROM, no cervical lymphadenopathy, thyromegaly, carotid bruit or JVD;  Breasts: deferred CHEST WALL: No tenderness  CHEST: Normal respiration, clear to auscultation bilaterally  HEART: Regular rate and rhythm; no murmurs rubs or gallops  BACK:  No kyphosis or scoliosis; no CVA tenderness  ABDOMEN: Positive Bowel Sounds, soft, non-tender; no masses, no organomegaly Rectal Exam: deferred EXTREMITIES: No cyanosis, clubbing, or edema Genitalia: not examined  SKIN:  no rash or ulceration  CNS:  Nonfocal exam, CN 2-12 intact  Labs on Admission:  Basic Metabolic Panel:  Recent Labs Lab 12/29/13 1219  NA 145  K 4.1  CL 105  CO2 26  GLUCOSE 255*  BUN 30*  CREATININE 1.41*  CALCIUM 9.3   Liver Function Tests:  Recent Labs Lab 12/29/13 1219  AST 19  ALT 24  ALKPHOS 72  BILITOT 0.4  PROT 7.3  ALBUMIN 2.9*   No results for input(s): LIPASE, AMYLASE in the last 168 hours. No results for input(s): AMMONIA in the last 168 hours. CBC:  Recent Labs Lab 12/29/13 1219  WBC 14.9*  NEUTROABS 14.5*  HGB 14.2  HCT 44.8  MCV 92.2  PLT 201    Cardiac Enzymes: No results for input(s): CKTOTAL, CKMB, CKMBINDEX, TROPONINI in the last 168 hours.  BNP (last 3 results) No results for input(s): PROBNP in the last 8760 hours. CBG: No results for input(s): GLUCAP in the last 168 hours.  Radiological Exams on Admission: Dg Chest 2 View  12/29/2013   CLINICAL DATA:  Fever.  Initial encounter  EXAM: CHEST  2 VIEW  COMPARISON:  12/01/2012  FINDINGS: Significantly limited study due to hypoaeration.  Chronic cardiomegaly, aortic tortuosity, and vascular pedicle widening. No overt edema, asymmetric opacity, or definitive effusion. No pneumothorax.  IMPRESSION: Very low lung volumes, which limits sensitivity. No definitive pneumonia.   Electronically Signed   By: Tiburcio PeaJonathan  Watts M.D.   On: 12/29/2013 13:38   Ct Chest Wo Contrast  12/29/2013   CLINICAL DATA:  Cough and shortness of breath.  EXAM: CT CHEST WITHOUT CONTRAST  TECHNIQUE: Multidetector CT imaging of the chest was performed following the standard protocol without IV contrast.  COMPARISON:  None.  FINDINGS: THORACIC INLET/BODY WALL:  No acute abnormality.  MEDIASTINUM:  Cardiomegaly. No pericardial effusion. Aortic and great vessel atherosclerosis. No evidence of acute vascular abnormality. No lymphadenopathy. Negative esophagus.  LUNG WINDOWS:  Very low lung volumes with mild basilar atelectasis. There is no edema, effusion, pneumothorax, or definitive pneumonia. As permitted by patient respiratory motion, no definite pulmonary nodule. There is tiny calcifications in the peripheral lower lobes bilaterally.  UPPER ABDOMEN:  Coarse curvilinear calcification in the region of the gallbladder fundus. This is most likely gallbladder wall calcification (which is associated with increased risk of cholangiocarcinoma), although a peripherally calcified, exophytic liver cyst or a calcified gallstone are diagnostic possibilities. There is a homogeneous, minimally imaged lesion in the left pararenal area  which is likely a exophytic cyst. Hepatic steatosis.  OSSEOUS:  No acute fracture.  No suspicious lytic or blastic lesions.  IMPRESSION: 1. Negative for pneumonia. 2. Low lung volumes with bilateral subsegmental atelectasis. 3. Calcification in the gallbladder fossa which favors porcelain gallbladder over gallstone.   Electronically Signed   By: Tiburcio PeaJonathan  Watts M.D.   On: 12/29/2013 16:20    EKG: Independently reviewed. Sinus rhythm with PVCs  Assessment/Plan Principal Problem:   Sepsis due to UTI (lower urinary tract infection) - will start Rocephin, f/u on UA and culture results  Active Problems:   AKI (acute kidney injury)-Dehydration - mild and due to Lasix which is being used for pedal edema- he has taken his dose for today - will hold tomorrow's dose    Dementia- Alzheimer's - PRN  Ativan ordered for agitation  BPH - may be the cause of the UTI - need to consider Flomax- will need post void residuals to be checked prior to leaving the hospital    Consulted: none  Code Status: DNR Family Communication: daughter- Stark Jocknn Brady-  DVT Prophylaxis:Heparin  Time spent: 45 min  Sita Mangen, MD [facility]  If 7PM-7AM, please contact night-coverage [email or web address] 12/29/2013, 8:55 PM

## 2013-12-29 NOTE — ED Provider Notes (Signed)
CSN: [id number]     Arrival date & time 12/29/13  1119 History   First MD Initiated Contact with Patient 12/29/13 1122     Chief Complaint  Patient presents with  . Weakness    Level V caveat: Dementia  HPI Patient is brought to the emergency department from the memory care unit today after he complained to nursing staff that he felt generally weak.  Found to have a fever of 101.3 rectally on arrival to the emergency department with noted tachypnea and tachycardia.  No hypotension.  Patient denies cough.  Denies abdominal pain.  He has no recollection of his discussion with nursing staff today.   Past Medical History  Diagnosis Date  . Dementia   . BPH (benign prostatic hypertrophy)   . Hyperlipidemia    History reviewed. No pertinent past surgical history. No family history on file. History  Substance Use Topics  . Smoking status: Never Smoker   . Smokeless tobacco: Not on file  . Alcohol Use: No    Review of Systems  Unable to perform ROS: Dementia      Allergies  Review of patient's allergies indicates no known allergies.  Home Medications   Prior to Admission medications   Medication Sig Start Date End Date Taking? Authorizing Provider  acetaminophen (TYLENOL) 500 MG tablet Take 1,000 mg by mouth 2 (two) times daily.   Yes Historical Provider, MD  donepezil (ARICEPT) 10 MG tablet Take 5 mg by mouth daily.    Yes Historical Provider, MD  furosemide (LASIX) 40 MG tablet Take 40 mg by mouth daily.    Yes Historical Provider, MD  LORazepam (ATIVAN) 0.5 MG tablet Take 0.5 mg by mouth every 6 (six) hours as needed (for anxiety / agitation).    Yes Historical Provider, MD  Melatonin 3 MG TABS Take 3 mg by mouth at bedtime.   Yes Historical Provider, MD  potassium chloride SA (K-DUR,KLOR-CON) 20 MEQ tablet Take 20 mEq by mouth daily.   Yes Historical Provider, MD  ranitidine (ZANTAC) 75 MG tablet Take 150 mg by mouth daily.    Yes Historical Provider, MD  Skin Protectants,  Misc. (BAZA PROTECT EX) Apply 1 application topically as needed (Apply to affected area after each incontinent episode. Notify MD if symptoms persist over a week.).   Yes Historical Provider, MD   BP 133/58 mmHg  Pulse 112  Temp(Src) 99 F (37.2 C) (Oral)  Resp 38  SpO2 93% Physical Exam  Constitutional: He is oriented to person, place, and time. He appears well-developed and well-nourished.  HENT:  Head: Normocephalic and atraumatic.  Eyes: EOM are normal.  Neck: Normal range of motion.  Cardiovascular: Regular rhythm, normal heart sounds and intact distal pulses.   tachycardia  Pulmonary/Chest: Effort normal and breath sounds normal. No respiratory distress.  tachypnea  Abdominal: Soft. He exhibits no distension. There is no tenderness.  Musculoskeletal: Normal range of motion.  Neurological: He is alert and oriented to person, place, and time.  Skin: Skin is warm and dry. No rash noted. No pallor.  Psychiatric: He has a normal mood and affect. Judgment normal.  Nursing note and vitals reviewed.   ED Course  Procedures   Labs Review Labs Reviewed  CBC WITH DIFFERENTIAL - Abnormal; Notable for the following:    WBC 14.9 (*)    Neutrophils Relative % 97 (*)    Neutro Abs 14.5 (*)    Lymphocytes Relative 2 (*)    Lymphs Abs 0.3 (*)  Monocytes Relative 1 (*)    All other components within normal limits  COMPREHENSIVE METABOLIC PANEL - Abnormal; Notable for the following:    Glucose, Bld 255 (*)    BUN 30 (*)    Creatinine, Ser 1.41 (*)    Albumin 2.9 (*)    GFR calc non Af Amer 44 (*)    GFR calc Af Amer 51 (*)    All other components within normal limits  URINALYSIS, ROUTINE W REFLEX MICROSCOPIC - Abnormal; Notable for the following:    APPearance CLOUDY (*)    Glucose, UA 250 (*)    Hgb urine dipstick MODERATE (*)    Leukocytes, UA SMALL (*)    All other components within normal limits  URINE MICROSCOPIC-ADD ON - Abnormal; Notable for the following:     Bacteria, UA FEW (*)    Casts GRANULAR CAST (*)    All other components within normal limits  CULTURE, BLOOD (ROUTINE X 2)  CULTURE, BLOOD (ROUTINE X 2)  URINE CULTURE  PROCALCITONIN  I-STAT CG4 LACTIC ACID, ED  I-STAT CG4 LACTIC ACID, ED    Imaging Review Dg Chest 2 View  12/29/2013   CLINICAL DATA:  Fever.  Initial encounter  EXAM: CHEST  2 VIEW  COMPARISON:  12/01/2012  FINDINGS: Significantly limited study due to hypoaeration.  Chronic cardiomegaly, aortic tortuosity, and vascular pedicle widening. No overt edema, asymmetric opacity, or definitive effusion. No pneumothorax.  IMPRESSION: Very low lung volumes, which limits sensitivity. No definitive pneumonia.   Electronically Signed   By: Tiburcio PeaJonathan  Watts M.D.   On: 12/29/2013 13:38  I personally reviewed the imaging tests through PACS system I reviewed available ER/hospitalization records through the EMR    EKG Interpretation   Date/Time:  Monday December 29 2013 11:25:13 EST Ventricular Rate:  123 PR Interval:  163 QRS Duration: 95 QT Interval:  310 QTC Calculation: 443 R Axis:   66 Text Interpretation:  Sinus tachycardia Ventricular bigeminy Borderline T  abnormalities, inferior leads Baseline wander in lead(s) V2 No significant  change was found Confirmed by Kala Ambriz  MD, Kasara Schomer ([id number]) on 12/29/2013  3:58:50 PM      MDM   Final diagnoses:  Fever  Cough  SOB (shortness of breath)    Patient with ongoing tachypnea.  Hypotension improving with IV fluids.  Fever control.  Rocephin for urinary tract infection.  Low lung volumes on chest were given his tachypnea patient will go back for CT of his chest without contrast to evaluate for occult pneumonia.  Low suspicion for PE. Patient will need to be admitted to the hospital for fever, infection, abnormal vital signs.  Admit to stepdown    Lyanne CoKevin M Latha Staunton, MD 12/29/13 [phone]

## 2013-12-29 NOTE — ED Notes (Signed)
Pt placed into gown and on monitor upon arrival to room. Pt monitored by blood pressure, pulse ox, and 12 lead. pts EKG given to and signed by Dr. Patria Maneampos.

## 2013-12-30 DIAGNOSIS — F0390 Unspecified dementia without behavioral disturbance: Secondary | ICD-10-CM

## 2013-12-30 LAB — BASIC METABOLIC PANEL
ANION GAP: 10 (ref 5–15)
BUN: 29 mg/dL — ABNORMAL HIGH (ref 6–23)
CO2: 26 mEq/L (ref 19–32)
Calcium: 8.8 mg/dL (ref 8.4–10.5)
Chloride: 110 mEq/L (ref 96–112)
Creatinine, Ser: 1.21 mg/dL (ref 0.50–1.35)
GFR calc Af Amer: 61 mL/min — ABNORMAL LOW (ref >90)
GFR calc non Af Amer: 53 mL/min — ABNORMAL LOW (ref >90)
Glucose, Bld: 249 mg/dL — ABNORMAL HIGH (ref 70–99)
POTASSIUM: 4.4 meq/L (ref 3.7–5.3)
SODIUM: 146 meq/L (ref 137–147)

## 2013-12-30 LAB — CBC
HCT: 42.2 % (ref 39.0–52.0)
HEMOGLOBIN: 13.5 g/dL (ref 13.0–17.0)
MCH: 29.2 pg (ref 26.0–34.0)
MCHC: 32 g/dL (ref 30.0–36.0)
MCV: 91.3 fL (ref 78.0–100.0)
Platelets: 187 10*3/uL (ref 150–400)
RBC: 4.62 MIL/uL (ref 4.22–5.81)
RDW: 13.9 % (ref 11.5–15.5)
WBC: 13.4 10*3/uL — AB (ref 4.0–10.5)

## 2013-12-30 MED ORDER — PIPERACILLIN-TAZOBACTAM 3.375 G IVPB
3.3750 g | Freq: Three times a day (TID) | INTRAVENOUS | Status: DC
  Administered 2013-12-30 – 2014-01-01 (×7): 3.375 g via INTRAVENOUS
  Filled 2013-12-30 (×10): qty 50

## 2013-12-30 NOTE — Plan of Care (Signed)
Problem: Phase I Progression Outcomes Goal: Pain controlled with appropriate interventions Outcome: Completed/Met Date Met:  12/30/13 Goal: OOB as tolerated unless otherwise ordered Outcome: Progressing Goal: Vital Signs stable- temperature less than 102 Outcome: Completed/Met Date Met:  12/30/13 Goal: Voiding-avoid urinary catheter unless indicated Outcome: Completed/Met Date Met:  12/30/13

## 2013-12-30 NOTE — Clinical Social Work Psychosocial (Signed)
Clinical Social Work Department BRIEF PSYCHOSOCIAL ASSESSMENT 12/30/2013  Patient:  Brett Mckee,Brett Mckee     Account Number:  [id number]     Admit date:  12/29/2013  Clinical Social Worker:  Stacy GardnerAVENPORT,Edris Schneck, CLINICAL SOCIAL WORKER  Date/Time:  12/30/2013 11:58 AM  Referred by:    Date Referred:  12/30/2013 Referred for  Psychosocial assessment   Other Referral:   Interview type:  Family Other interview type:   Patient's daughter, [name],  was interviewed by [employer] CoBSW intern at bedside.    PSYCHOSOCIAL DATA Living Status:  FACILITY Admitted from facility:  [facility] Level of care:  Assisted Living Primary support name:  Daughter [name] and Son [name] Primary support relationship to patient:  CHILD, ADULT Degree of support available:   Strong.    CURRENT CONCERNS Current Concerns  Post-Acute Placement   Other Concerns:   N/A    SOCIAL WORK ASSESSMENT / PLAN BSW intern assessed client at bedside. The patient has been at [facility] for the past ten years and his daughter said they have been very happy with his time there. He is currently in [facility]'s memory care center. The daughter mentioned that her brother is also a very strong support for their father. They both live very close to their father and see him as often as possible. Patient and daughter appeared to be in good spirits, and engaged in assessment.   Assessment/plan status:  Psychosocial Support/Ongoing Assessment of Needs Other assessment/ plan:   Complete fl2, fax to [facility].   Information/referral to community resources:   CSW contact information given to family.    PATIENT'S/FAMILY'S RESPONSE TO PLAN OF CARE: Patient and daughter agreeable to discharge back to ALF, memory care unit once medically stable.CSW will assit when apropate.     Stacy GardnerErin Dorlene Footman, BSW Intern, [phone]

## 2013-12-30 NOTE — Progress Notes (Signed)
11/10 lab glucose of 255 mg/dL yesterday and 161249 today.  No dx of dm noted. May want to order a HgbA1C and check glucose while here [facility], using sensitive correction.   Thank you, Lenor CoffinAnn Ledora Delker, RN, CNS, Diabetes Coordinator [phone])

## 2013-12-30 NOTE — Progress Notes (Signed)
PROGRESS NOTE   Brett Mckee EAV:[id number] DOB: 12/19/1926 DOA: 12/29/2013 PCP: Darnelle BosSBORNE,JAMES CHARLES, MD   HPI: 80 yo male with Alzheimer's dementia sent to the ED from nursing facility for confusion.  Pt was found to have a fever and UTI.  Daughter reports pt has been able to perform his normal activities but has been more confused.  No hx of UTI.  PMH of BPH, Alzheimer's dementia, and hyperlipidemia.  Subjective: Alzheimer's Today pt is pleasant and appears comfortable.  Confused.  Daughter reports that pt's confusion and overall appearance seems improved since yesterday.     Assessment/Plan: Sepsis due to UTI: Improving.  UA was cloudy and showed 11-20 WBC and few bacteria.  WBC trending down.  RR and HR are within normal range.  No longer hypotensive last BP 150/58.  Afebrile. Blood and urine cultures pending.  Continue IV Rocephin for one more dose.  Will transition to PO abx likely tomorrow.   - clinically he is improving  Acute Kidney Injury: Likely due to sepsis from UTI and dehydration.  - improving with hydration  Dehydration: Resolved.  Hold Lasix. Will resume when discharged.  Elevated blood glucose: Likely due to infection.  BG ranging from 249- 255.  Measure hemoglobin A1C.  Start SSI.  Edema: Chronic problem.  Hold Lasix for now given AK I. Will resume when discharged.    Hyperlipidemia: Chronic problem.  Not on statin therapy.    Alzheimer's dementia: Chronic problem.  Continue Aricept 5mg  daily.    BPH: Urinary retention could be cause of UTI.  Not on medication currently.  Consider starting Flomax.       DVT Prophylaxis: Heparin 5,000 units q8h   Code Status: DNR Family Communication: Pt has dementia.  Daughter at beside.  Disposition Plan: Will d/c back to [facility] once medically appropriate.    Consultants:  None  Procedures:  None  Antibiotics:  IV Rocephin 1g  Objective: Filed Vitals:   12/29/13 1753 12/29/13 2221  12/29/13 2235 12/30/13 0653  BP: 140/53 176/143 141/62 150/58  Pulse: 92 98  91  Temp: 98.4 F (36.9 C) 98.6 F (37 C)  98.2 F (36.8 C)  TempSrc: Oral Oral  Oral  Resp: 28 26  20   Height: 6' (1.829 m)     Weight: 100.109 kg (220 lb 11.2 oz)     SpO2: 94% 98%  92%    Intake/Output Summary (Last 24 hours) at 12/30/13 1234 Last data filed at 12/30/13 1220  Gross per 24 hour  Intake      0 ml  Output      7 ml  Net     -7 ml   Filed Weights   12/29/13 1753  Weight: 100.109 kg (220 lb 11.2 oz)    Exam: General: Pleasantly confused, NAD HEENT:  EOMI, Anicteic Sclera, MMM.   Cardiovascular: RRR, S1 S2 auscultated, 2-3/6 holosystolic murmur    Respiratory: Clear to auscultation bilaterally with equal chest rise, no accessory muscle use  Abdomen: Soft, nontender, nondistended, + bowel sounds  Extremities: warm dry without cyanosis clubbing or edema. Wearing compression stockings.  Neuro: Oriented to self but not place or time.  Cranial nerves grossly intact. Strength 5/5 in upper and lower extremities  Skin: Without rashes exudates or nodules.      Data Reviewed: Basic Metabolic Panel:  Recent Labs Lab 12/29/13 1219 12/30/13 0555  NA 145 146  K 4.1 4.4  CL 105 110  CO2 26 26  GLUCOSE 255* 249*  BUN 30* 29*  CREATININE 1.41* 1.21  CALCIUM 9.3 8.8   Liver Function Tests:  Recent Labs Lab 12/29/13 1219  AST 19  ALT 24  ALKPHOS 72  BILITOT 0.4  PROT 7.3  ALBUMIN 2.9*   CBC:  Recent Labs Lab 12/29/13 1219 12/30/13 0555  WBC 14.9* 13.4*  NEUTROABS 14.5*  --   HGB 14.2 13.5  HCT 44.8 42.2  MCV 92.2 91.3  PLT 201 187    Recent Results (from the past 240 hour(s))  Blood Culture (routine x 2)     Status: None (Preliminary result)   Collection Time: 12/29/13 12:15 PM  Result Value Ref Range Status   Specimen Description BLOOD RIGHT HAND  Final   Special Requests BOTTLES DRAWN AEROBIC ONLY 4CCS  Final   Culture  Setup Time   Final    12/29/2013  17:18 Performed at [facility]    Culture   Final           BLOOD CULTURE RECEIVED NO GROWTH TO DATE CULTURE WILL BE HELD FOR 5 DAYS BEFORE ISSUING A FINAL NEGATIVE REPORT Performed at [facility]    Report Status PENDING  Incomplete  Blood Culture (routine x 2)     Status: None (Preliminary result)   Collection Time: 12/29/13 12:20 PM  Result Value Ref Range Status   Specimen Description BLOOD RIGHT ANTECUBITAL  Final   Special Requests BOTTLES DRAWN AEROBIC AND ANAEROBIC 10MLS  Final   Culture  Setup Time   Final    12/29/2013 17:18 Performed at [facility]    Culture   Final           BLOOD CULTURE RECEIVED NO GROWTH TO DATE CULTURE WILL BE HELD FOR 5 DAYS BEFORE ISSUING A FINAL NEGATIVE REPORT Performed at [facility]    Report Status PENDING  Incomplete  Urine culture     Status: None (Preliminary result)   Collection Time: 12/29/13 12:31 PM  Result Value Ref Range Status   Specimen Description URINE, CATHETERIZED  Final   Special Requests NONE  Final   Culture  Setup Time   Final    12/29/2013 17:21 Performed at [facility]    Colony Count   Final    80,000 COLONIES/ML Performed at [facility]    Culture   Final    ESCHERICHIA COLI Performed at [facility]    Report Status PENDING  Incomplete  MRSA PCR Screening     Status: None   Collection Time: 12/29/13  7:43 PM  Result Value Ref Range Status   MRSA by PCR NEGATIVE NEGATIVE Final    Comment:        The GeneXpert MRSA Assay (FDA approved for NASAL specimens only), is one component of a comprehensive MRSA colonization surveillance program. It is not intended to diagnose MRSA infection nor to guide or monitor treatment for MRSA infections.      Studies: Dg Chest 2 View  12/29/2013   CLINICAL DATA:  Fever.  Initial encounter  EXAM: CHEST  2 VIEW  COMPARISON:  12/01/2012  FINDINGS: Significantly limited study due to hypoaeration.  Chronic  cardiomegaly, aortic tortuosity, and vascular pedicle widening. No overt edema, asymmetric opacity, or definitive effusion. No pneumothorax.  IMPRESSION: Very low lung volumes, which limits sensitivity. No definitive pneumonia.   Electronically Signed   By: Tiburcio PeaJonathan  Watts M.D.   On: 12/29/2013 13:38   Ct Chest Wo Contrast  12/29/2013   CLINICAL DATA:  Cough and shortness of breath.  EXAM: CT CHEST WITHOUT CONTRAST  TECHNIQUE: Multidetector CT imaging of the chest was performed following the standard protocol without IV contrast.  COMPARISON:  None.  FINDINGS: THORACIC INLET/BODY WALL:  No acute abnormality.  MEDIASTINUM:  Cardiomegaly. No pericardial effusion. Aortic and great vessel atherosclerosis. No evidence of acute vascular abnormality. No lymphadenopathy. Negative esophagus.  LUNG WINDOWS:  Very low lung volumes with mild basilar atelectasis. There is no edema, effusion, pneumothorax, or definitive pneumonia. As permitted by patient respiratory motion, no definite pulmonary nodule. There is tiny calcifications in the peripheral lower lobes bilaterally.  UPPER ABDOMEN:  Coarse curvilinear calcification in the region of the gallbladder fundus. This is most likely gallbladder wall calcification (which is associated with increased risk of cholangiocarcinoma), although a peripherally calcified, exophytic liver cyst or a calcified gallstone are diagnostic possibilities. There is a homogeneous, minimally imaged lesion in the left pararenal area which is likely a exophytic cyst. Hepatic steatosis.  OSSEOUS:  No acute fracture.  No suspicious lytic or blastic lesions.  IMPRESSION: 1. Negative for pneumonia. 2. Low lung volumes with bilateral subsegmental atelectasis. 3. Calcification in the gallbladder fossa which favors porcelain gallbladder over gallstone.   Electronically Signed   By: Tiburcio Pea M.D.   On: 12/29/2013 16:20    Scheduled Meds: . cefTRIAXone (ROCEPHIN)  IV  1 g Intravenous Q24H  .  donepezil  5 mg Oral Daily  . famotidine  20 mg Oral Daily  . heparin  5,000 Units Subcutaneous 3 times per day  . sodium chloride  3 mL Intravenous Q12H   Continuous Infusions:   Principal Problem:   Sepsis Active Problems:   AKI (acute kidney injury)   Dementia   Dehydration   Edema   UTI (lower urinary tract infection)   Vicki Mallet PA-S  [facility] Pager [phone]. If 7PM-7AM, please contact night-coverage at [email or web address], password [facility] 12/30/2013, 12:34 PM  LOS: 1 day      Patient seen and examined, chart and data base reviewed.  I agree with the above assessment and plan.  80 year old male admitted with acute on chronic dementia due to urinary tract infection. Continue IV antibiotics for today, her microbiology may be able to transition to oral antibiotics and may be able to be discharged tomorrow.  Pamella Pert, MD [facility] [phone]   Time spent: 25 minutes  Costin M. Elvera Lennox, MD [facility] [phone]

## 2013-12-30 NOTE — Plan of Care (Signed)
Problem: Phase I Progression Outcomes Goal: Tolerating diet Outcome: Completed/Met Date Met:  12/30/13 Goal: Hemodynamically stable Outcome: Progressing  Problem: Phase II Progression Outcomes Goal: Tolerating diet Outcome: Completed/Met Date Met:  12/30/13 Goal: Voiding independently Outcome: Completed/Met Date Met:  12/30/13  Problem: Phase III Progression Outcomes Goal: Tolerating diet Outcome: Completed/Met Date Met:  12/30/13

## 2013-12-30 NOTE — Progress Notes (Signed)
Paged Dr. About Mr. Cory RoughenKirkman positive blood cultures. Keene Breathobert Carrithers [phone] His blood cultures are positive for gram negative rods.

## 2013-12-31 ENCOUNTER — Inpatient Hospital Stay (HOSPITAL_COMMUNITY): Payer: Medicare Other

## 2013-12-31 DIAGNOSIS — N3000 Acute cystitis without hematuria: Secondary | ICD-10-CM | POA: Insufficient documentation

## 2013-12-31 DIAGNOSIS — A4151 Sepsis due to Escherichia coli [E. coli]: Secondary | ICD-10-CM

## 2013-12-31 DIAGNOSIS — R7881 Bacteremia: Secondary | ICD-10-CM

## 2013-12-31 LAB — CBC
HEMATOCRIT: 42.8 % (ref 39.0–52.0)
Hemoglobin: 13.7 g/dL (ref 13.0–17.0)
MCH: 29.1 pg (ref 26.0–34.0)
MCHC: 32 g/dL (ref 30.0–36.0)
MCV: 91.1 fL (ref 78.0–100.0)
PLATELETS: 199 10*3/uL (ref 150–400)
RBC: 4.7 MIL/uL (ref 4.22–5.81)
RDW: 13.6 % (ref 11.5–15.5)
WBC: 8.7 10*3/uL (ref 4.0–10.5)

## 2013-12-31 LAB — BASIC METABOLIC PANEL
ANION GAP: 11 (ref 5–15)
BUN: 26 mg/dL — AB (ref 6–23)
CHLORIDE: 107 meq/L (ref 96–112)
CO2: 25 mEq/L (ref 19–32)
Calcium: 9.4 mg/dL (ref 8.4–10.5)
Creatinine, Ser: 1.15 mg/dL (ref 0.50–1.35)
GFR calc non Af Amer: 56 mL/min — ABNORMAL LOW (ref >90)
GFR, EST AFRICAN AMERICAN: 65 mL/min — AB (ref >90)
Glucose, Bld: 277 mg/dL — ABNORMAL HIGH (ref 70–99)
POTASSIUM: 4.5 meq/L (ref 3.7–5.3)
Sodium: 143 mEq/L (ref 137–147)

## 2013-12-31 LAB — URINE CULTURE: Colony Count: 80000

## 2013-12-31 LAB — HEMOGLOBIN A1C
Hgb A1c MFr Bld: 9.2 % — ABNORMAL HIGH (ref <5.7)
MEAN PLASMA GLUCOSE: 217 mg/dL — AB (ref <117)

## 2013-12-31 MED ORDER — RESOURCE THICKENUP CLEAR PO POWD
ORAL | Status: DC | PRN
  Filled 2013-12-31: qty 125

## 2013-12-31 MED ORDER — ALBUTEROL SULFATE (2.5 MG/3ML) 0.083% IN NEBU
2.5000 mg | INHALATION_SOLUTION | Freq: Four times a day (QID) | RESPIRATORY_TRACT | Status: DC
  Administered 2013-12-31 – 2014-01-01 (×6): 2.5 mg via RESPIRATORY_TRACT
  Filled 2013-12-31 (×6): qty 3

## 2013-12-31 MED ORDER — LEVALBUTEROL HCL 1.25 MG/0.5ML IN NEBU
1.2500 mg | INHALATION_SOLUTION | Freq: Once | RESPIRATORY_TRACT | Status: AC
  Administered 2013-12-31: 1.25 mg via RESPIRATORY_TRACT
  Filled 2013-12-31: qty 0.5

## 2013-12-31 MED ORDER — CETYLPYRIDINIUM CHLORIDE 0.05 % MT LIQD
7.0000 mL | Freq: Two times a day (BID) | OROMUCOSAL | Status: DC
  Administered 2013-12-31 – 2014-01-02 (×4): 7 mL via OROMUCOSAL

## 2013-12-31 MED ORDER — FUROSEMIDE 40 MG PO TABS
40.0000 mg | ORAL_TABLET | Freq: Every day | ORAL | Status: DC
  Administered 2013-12-31 – 2014-01-02 (×3): 40 mg via ORAL
  Filled 2013-12-31 (×3): qty 1

## 2013-12-31 NOTE — Evaluation (Signed)
Clinical/Bedside Swallow Evaluation Patient Details  Name: Brett Mckee MRN: [id number] Date of Birth: 06/05/1926  Today's Date: 12/31/2013 Time: [id number] SLP Time Calculation (min) (ACUTE ONLY): 27 min  Past Medical History:  Past Medical History  Diagnosis Date  . Dementia   . BPH (benign prostatic hypertrophy)   . Hyperlipidemia    Past Surgical History: History reviewed. No pertinent past surgical history. HPI:  80 yo male with Alzheimer's dementia sent to the ED from nursing facility for confusion. Pt was found to have a fever and UTI. Daughter reports pt has been able to perform his normal activities but has been more confused. No hx of UTI. PMH of BPH, Alzheimer's dementia, and hyperlipidemia. Pt reportedly choked on lunch 12/31/13 and has had increased wheezing since that time.   Assessment / Plan / Recommendation Clinical Impression  Pt has audible wheezing at baseline after breathing treatment provided by RT. Pt has intermittent inhalations immediately following the swallow with throat clearing, concerning for difficulty coordinating breath/swallow. Pt does not show overt signs of aspiration with nectar thick liquids or solids, although his decreased sustained attention increases his aspiration risk as he frequently tries to talk wth PO in his mouth. SLP provided Mod cues for sustained attention and slow rate of intake to increase safety. Given current respiratory status and the above, recommend Dys 2 diet and nectar thick liquids. SLP to follow for diet tolerance and readiness to advance as breathing normalizes.    Aspiration Risk  Moderate    Diet Recommendation Dysphagia 2 (Fine chop);Nectar-thick liquid   Liquid Administration via: Cup;No straw Medication Administration: Whole meds with puree Supervision: Patient able to self feed;Full supervision/cueing for compensatory strategies Compensations: Slow rate;Small sips/bites Postural Changes and/or Swallow  Maneuvers: Seated upright 90 degrees    Other  Recommendations Oral Care Recommendations: Oral care BID Other Recommendations: Order thickener from pharmacy;Prohibited food (jello, ice cream, thin soups);Remove water pitcher   Follow Up Recommendations   (tbd, none anticipated)    Frequency and Duration min 2x/week  2 weeks   Pertinent Vitals/Pain n/a    SLP Swallow Goals     Swallow Study Prior Functional Status       General HPI: 80 yo male with Alzheimer's dementia sent to the ED from nursing facility for confusion. Pt was found to have a fever and UTI. Daughter reports pt has been able to perform his normal activities but has been more confused. No hx of UTI. PMH of BPH, Alzheimer's dementia, and hyperlipidemia. Pt reportedly choked on lunch 12/31/13 and has had increased wheezing since that time. Type of Study: Bedside swallow evaluation Previous Swallow Assessment: none in chart Diet Prior to this Study: NPO Temperature Spikes Noted: Yes (low grade) Respiratory Status: Room air History of Recent Intubation: No Behavior/Cognition: Alert;Cooperative;Pleasant mood;Confused;Decreased sustained attention Oral Cavity - Dentition: Adequate natural dentition Self-Feeding Abilities: Able to feed self Patient Positioning: Upright in bed Baseline Vocal Quality: Clear    Oral/Motor/Sensory Function Overall Oral Motor/Sensory Function: Appears within functional limits for tasks assessed   Ice Chips Ice chips: Not tested   Thin Liquid Thin Liquid: Impaired Presentation: Cup;Self Fed Pharyngeal  Phase Impairments: Suspected delayed Swallow;Throat Clearing - Immediate;Other (comments) (inhalation immeditely post swallow)    Nectar Thick Nectar Thick Liquid: Impaired Presentation: Cup;Self Fed Pharyngeal Phase Impairments: Suspected delayed Swallow   Honey Thick Honey Thick Liquid: Not tested   Puree Puree: Impaired Presentation: Spoon Oral Phase Impairments: Other (comment)  (decreased sustained attention to bolus)  Solid   GO    Solid: Impaired Presentation: Spoon Oral Phase Impairments: Other (comment) (decreased sustained attention to PO)        Maxcine HamLaura Paiewonsky, M.A. CCC-SLP [phone]  Maxcine Hamaiewonsky, Geremiah Fussell 12/31/2013,4:29 PM

## 2013-12-31 NOTE — Progress Notes (Signed)
PROGRESS NOTE  Brett Mckee WUJ:[id number] DOB: 09/29/1926 DOA: 12/29/2013 PCP: Darnelle BosSBORNE,JAMES CHARLES, MD  Assessment/Plan: Sepsis -Present at the time of admission -Secondary to UTI and bacteremia -Fever and WBC is improved -continue IV Zosyn pending culture data CKD stage III -Baseline creatinine 1.1-1.5 Dyspnea -CXR -restart lasix Bacteremia -Source is likely urine -Continue IV Zosyn pending culture data Although suspect isolate will be Escherichia coli UTI -Escherichia coli Acute encephalopathy -Secondary to infectious process -Patient has baseline dementia -Continue Aricept Hyperglycemia -Hemoglobin A1c Presacral erythema -no skin break down but [facility] wants WOCN     Family Communication:   Daughter at beside Disposition Plan:   [facility] when medically stable        Procedures/Studies: Dg Chest 2 View  12/29/2013   CLINICAL DATA:  Fever.  Initial encounter  EXAM: CHEST  2 VIEW  COMPARISON:  12/01/2012  FINDINGS: Significantly limited study due to hypoaeration.  Chronic cardiomegaly, aortic tortuosity, and vascular pedicle widening. No overt edema, asymmetric opacity, or definitive effusion. No pneumothorax.  IMPRESSION: Very low lung volumes, which limits sensitivity. No definitive pneumonia.   Electronically Signed   By: Tiburcio PeaJonathan  Watts M.D.   On: 12/29/2013 13:38   Ct Chest Wo Contrast  12/29/2013   CLINICAL DATA:  Cough and shortness of breath.  EXAM: CT CHEST WITHOUT CONTRAST  TECHNIQUE: Multidetector CT imaging of the chest was performed following the standard protocol without IV contrast.  COMPARISON:  None.  FINDINGS: THORACIC INLET/BODY WALL:  No acute abnormality.  MEDIASTINUM:  Cardiomegaly. No pericardial effusion. Aortic and great vessel atherosclerosis. No evidence of acute vascular abnormality. No lymphadenopathy. Negative esophagus.  LUNG WINDOWS:  Very low lung volumes with mild basilar atelectasis. There is no edema,  effusion, pneumothorax, or definitive pneumonia. As permitted by patient respiratory motion, no definite pulmonary nodule. There is tiny calcifications in the peripheral lower lobes bilaterally.  UPPER ABDOMEN:  Coarse curvilinear calcification in the region of the gallbladder fundus. This is most likely gallbladder wall calcification (which is associated with increased risk of cholangiocarcinoma), although a peripherally calcified, exophytic liver cyst or a calcified gallstone are diagnostic possibilities. There is a homogeneous, minimally imaged lesion in the left pararenal area which is likely a exophytic cyst. Hepatic steatosis.  OSSEOUS:  No acute fracture.  No suspicious lytic or blastic lesions.  IMPRESSION: 1. Negative for pneumonia. 2. Low lung volumes with bilateral subsegmental atelectasis. 3. Calcification in the gallbladder fossa which favors porcelain gallbladder over gallstone.   Electronically Signed   By: Tiburcio PeaJonathan  Watts M.D.   On: 12/29/2013 16:20         Subjective: Patient appears somewhat dyspneic this morning although he denies any shortness of breath. He is pleasant and confused. Denies any headache, chest pain, abdominal pain. No reports of diarrhea or vomiting.  Objective: Filed Vitals:   12/30/13 2100 12/31/13 0433 12/31/13 0700 12/31/13 0923  BP: 135/78  144/71 155/82  Pulse: 92  73 86  Temp: 99.1 F (37.3 C)  98.1 F (36.7 C)   TempSrc: Oral  Oral   Resp: 18  22   Height:      Weight:      SpO2: 93% 93% 97% 96%    Intake/Output Summary (Last 24 hours) at 12/31/13 1008 Last data filed at 12/31/13 0909  Gross per 24 hour  Intake    603 ml  Output      3 ml  Net  600 ml   Weight change:  Exam:   General:  Pt is alert, follows commands appropriately, not in acute distress  HEENT: No icterus, No thrush,  [place]/AT  Cardiovascular: RRR, S1/S2, no rubs, no gallops  Respiratory: bibasilar crackles, no wheezing  Abdomen: Soft/+BS, non tender, non  distended, no guarding  Extremities: trace LE edema, No lymphangitis, No petechiae, No rashes, no synovitis  Data Reviewed: Basic Metabolic Panel:  Recent Labs Lab 12/29/13 1219 12/30/13 0555 12/31/13 0518  NA 145 146 143  K 4.1 4.4 4.5  CL 105 110 107  CO2 26 26 25   GLUCOSE 255* 249* 277*  BUN 30* 29* 26*  CREATININE 1.41* 1.21 1.15  CALCIUM 9.3 8.8 9.4   Liver Function Tests:  Recent Labs Lab 12/29/13 1219  AST 19  ALT 24  ALKPHOS 72  BILITOT 0.4  PROT 7.3  ALBUMIN 2.9*   No results for input(s): LIPASE, AMYLASE in the last 168 hours. No results for input(s): AMMONIA in the last 168 hours. CBC:  Recent Labs Lab 12/29/13 1219 12/30/13 0555 12/31/13 0518  WBC 14.9* 13.4* 8.7  NEUTROABS 14.5*  --   --   HGB 14.2 13.5 13.7  HCT 44.8 42.2 42.8  MCV 92.2 91.3 91.1  PLT 201 187 199   Cardiac Enzymes: No results for input(s): CKTOTAL, CKMB, CKMBINDEX, TROPONINI in the last 168 hours. BNP: Invalid input(s): POCBNP CBG: No results for input(s): GLUCAP in the last 168 hours.  Recent Results (from the past 240 hour(s))  Blood Culture (routine x 2)     Status: None (Preliminary result)   Collection Time: 12/29/13 12:15 PM  Result Value Ref Range Status   Specimen Description BLOOD RIGHT HAND  Final   Special Requests BOTTLES DRAWN AEROBIC ONLY 4CCS  Final   Culture  Setup Time   Final    12/29/2013 17:18 Performed at [facility]    Culture   Final    GRAM NEGATIVE RODS Note: Gram Stain Report Called to,Read Back By and Verified With: Burnett Corrente ON 12/30/2013 AT 10:40P BY WILEJ Performed at [facility]    Report Status PENDING  Incomplete  Blood Culture (routine x 2)     Status: None (Preliminary result)   Collection Time: 12/29/13 12:20 PM  Result Value Ref Range Status   Specimen Description BLOOD RIGHT ANTECUBITAL  Final   Special Requests BOTTLES DRAWN AEROBIC AND ANAEROBIC  Final   Culture  Setup Time   Final     12/29/2013 17:18 Performed at [facility]    Culture   Final           BLOOD CULTURE RECEIVED NO GROWTH TO DATE CULTURE WILL BE HELD FOR 5 DAYS BEFORE ISSUING A FINAL NEGATIVE REPORT Performed at [facility]    Report Status PENDING  Incomplete  Urine culture     Status: None   Collection Time: 12/29/13 12:31 PM  Result Value Ref Range Status   Specimen Description URINE, CATHETERIZED  Final   Special Requests NONE  Final   Culture  Setup Time   Final    12/29/2013 17:21 Performed at [facility] Count   Final    80,000 COLONIES/ML Performed at [facility]    Culture   Final    ESCHERICHIA COLI Performed at [facility]    Report Status 12/31/2013 FINAL  Final   Organism ID, Bacteria ESCHERICHIA COLI  Final  Susceptibility   Escherichia coli - MIC*    AMPICILLIN <=2 SENSITIVE Sensitive     CEFAZOLIN <=4 SENSITIVE Sensitive     CEFTRIAXONE <=1 SENSITIVE Sensitive     CIPROFLOXACIN <=0.25 SENSITIVE Sensitive     GENTAMICIN <=1 SENSITIVE Sensitive     LEVOFLOXACIN <=0.12 SENSITIVE Sensitive     NITROFURANTOIN <=16 SENSITIVE Sensitive     TOBRAMYCIN <=1 SENSITIVE Sensitive     TRIMETH/SULFA <=20 SENSITIVE Sensitive     PIP/TAZO <=4 SENSITIVE Sensitive     * ESCHERICHIA COLI  MRSA PCR Screening     Status: None   Collection Time: 12/29/13  7:43 PM  Result Value Ref Range Status   MRSA by PCR NEGATIVE NEGATIVE Final    Comment:        The GeneXpert MRSA Assay (FDA approved for NASAL specimens only), is one component of a comprehensive MRSA colonization surveillance program. It is not intended to diagnose MRSA infection nor to guide or monitor treatment for MRSA infections.      Scheduled Meds: . donepezil  5 mg Oral Daily  . famotidine  20 mg Oral Daily  . furosemide  40 mg Oral Daily  . heparin  5,000 Units Subcutaneous 3 times per day  . piperacillin-tazobactam (ZOSYN)  IV  3.375 g Intravenous 3 times  per day  . sodium chloride  3 mL Intravenous Q12H   Continuous Infusions:    Brier Reid, DO  [facility] Pager [phone]  If 7PM-7AM, please contact night-coverage [email or web address] Password [email or web address] 12/31/2013, 10:08 AM   LOS: 2 days

## 2013-12-31 NOTE — Progress Notes (Signed)
Daughter report that patient choked while she was feeding him lunch at 1400. Made RN aware now. Pt's VSS stable. No acute distress noted, just increase in wheezing. Dr. Arbutus Leasat made aware.

## 2013-12-31 NOTE — Progress Notes (Signed)
Called Dr. About patient increase working to breathe and wheezing. Nebulizer ordered, will continue to monitor.

## 2013-12-31 NOTE — Consult Note (Signed)
WOC wound consult note Reason for Consult: Consult requested for sacrum area.  Pt with patchy areas of redness and partial thickness breakdown; appearance consistent with moisture associated skin damage.  Pt is frequently incontinent of urine.  Dressing procedure/placement/frequency: Barrier cream to repel moisture and protect skin.  Discussed with wife at bedside; she verbalizes understanding and denies further questions. Please re-consult if further assistance is needed.  Thank-you,  Cammie Mcgeeawn Ioannis Schuh MSN, RN, CWOCN, StarkWCN-AP, CNS [phone]

## 2014-01-01 ENCOUNTER — Inpatient Hospital Stay (HOSPITAL_COMMUNITY): Payer: Medicare Other

## 2014-01-01 DIAGNOSIS — J690 Pneumonitis due to inhalation of food and vomit: Secondary | ICD-10-CM

## 2014-01-01 LAB — PRO B NATRIURETIC PEPTIDE: Pro B Natriuretic peptide (BNP): 1228 pg/mL — ABNORMAL HIGH (ref 0–450)

## 2014-01-01 LAB — BASIC METABOLIC PANEL
ANION GAP: 17 — AB (ref 5–15)
BUN: 29 mg/dL — ABNORMAL HIGH (ref 6–23)
CHLORIDE: 100 meq/L (ref 96–112)
CO2: 23 mEq/L (ref 19–32)
Calcium: 9.4 mg/dL (ref 8.4–10.5)
Creatinine, Ser: 1.51 mg/dL — ABNORMAL HIGH (ref 0.50–1.35)
GFR calc Af Amer: 47 mL/min — ABNORMAL LOW (ref >90)
GFR calc non Af Amer: 40 mL/min — ABNORMAL LOW (ref >90)
Glucose, Bld: 305 mg/dL — ABNORMAL HIGH (ref 70–99)
POTASSIUM: 4.1 meq/L (ref 3.7–5.3)
Sodium: 140 mEq/L (ref 137–147)

## 2014-01-01 LAB — GLUCOSE, CAPILLARY
GLUCOSE-CAPILLARY: 440 mg/dL — AB (ref 70–99)
Glucose-Capillary: 439 mg/dL — ABNORMAL HIGH (ref 70–99)
Glucose-Capillary: 473 mg/dL — ABNORMAL HIGH (ref 70–99)

## 2014-01-01 MED ORDER — ALBUTEROL SULFATE (2.5 MG/3ML) 0.083% IN NEBU
2.5000 mg | INHALATION_SOLUTION | Freq: Three times a day (TID) | RESPIRATORY_TRACT | Status: DC
  Administered 2014-01-02 (×2): 2.5 mg via RESPIRATORY_TRACT
  Filled 2014-01-01 (×2): qty 3

## 2014-01-01 MED ORDER — INSULIN ASPART 100 UNIT/ML ~~LOC~~ SOLN
7.0000 [IU] | Freq: Once | SUBCUTANEOUS | Status: AC
  Administered 2014-01-01: 7 [IU] via SUBCUTANEOUS

## 2014-01-01 MED ORDER — INSULIN ASPART 100 UNIT/ML ~~LOC~~ SOLN
12.0000 [IU] | Freq: Once | SUBCUTANEOUS | Status: AC
  Administered 2014-01-01: 12 [IU] via SUBCUTANEOUS

## 2014-01-01 MED ORDER — INSULIN ASPART 100 UNIT/ML ~~LOC~~ SOLN
0.0000 [IU] | Freq: Three times a day (TID) | SUBCUTANEOUS | Status: DC
  Administered 2014-01-02: 7 [IU] via SUBCUTANEOUS
  Administered 2014-01-02: 3 [IU] via SUBCUTANEOUS

## 2014-01-01 MED ORDER — ALBUTEROL SULFATE (2.5 MG/3ML) 0.083% IN NEBU: 2.5000 mg | INHALATION_SOLUTION | RESPIRATORY_TRACT | Status: DC | PRN

## 2014-01-01 MED ORDER — INSULIN ASPART 100 UNIT/ML ~~LOC~~ SOLN
0.0000 [IU] | Freq: Every day | SUBCUTANEOUS | Status: DC
  Administered 2014-01-01: 5 [IU] via SUBCUTANEOUS

## 2014-01-01 NOTE — Progress Notes (Signed)
Speech Language Pathology Treatment: Dysphagia  Patient Details Name: Brett Mckee MRN: [id number] DOB: 04/12/1926 Today's Date: 01/01/2014 Time: [id number] SLP Time Calculation (min) (ACUTE ONLY): 15 min  Assessment / Plan / Recommendation Clinical Impression  Pt. seen for skilled dysphagia treatment following breathing treatment with RT.  Mild wheeze and work of breathing exhibited at baseline.  Respiratory effort did not increase during observation with part of breakfast.  Oral and pharyngeal swallow phases appeared safe/efficient with Dys 2 texture and nectar thick liquid.  No family present to educate.  Did not attempt trials thin liquid due to baseline respiratory effort.  Continue ST.   HPI HPI: 80 yo male with Alzheimer's dementia sent to the ED from nursing facility for confusion. Pt was found to have a fever and UTI. Daughter reports pt has been able to perform his normal activities but has been more confused. No hx of UTI. PMH of BPH, Alzheimer's dementia, and hyperlipidemia. Pt reportedly choked on lunch 12/31/13 and has had increased wheezing since that time.   Pertinent Vitals Pain Assessment: No/denies pain  SLP Plan  Continue with current plan of care    Recommendations Diet recommendations: Dysphagia 2 (fine chop);Nectar-thick liquid Liquids provided via: Cup;No straw Medication Administration: Whole meds with puree Supervision: Patient able to self feed;Full supervision/cueing for compensatory strategies Compensations: Slow rate;Small sips/bites Postural Changes and/or Swallow Maneuvers: Seated upright 90 degrees              Oral Care Recommendations: Oral care BID Follow up Recommendations:  (TBD) Plan: Continue with current plan of care    GO     Royce MacadamiaLitaker, Brett Mckee 01/01/2014, 9:09 AM   Breck CoonsLisa Mckee Lonell FaceLitaker M.Ed [employer]-SLP Pager [phone]

## 2014-01-01 NOTE — Plan of Care (Signed)
Problem: Phase III Progression Outcomes Goal: Pain controlled on oral analgesia Outcome: Completed/Met Date Met:  01/01/14 Goal: Activity at appropriate level-compared to baseline (UP IN CHAIR FOR HEMODIALYSIS)  Outcome: Progressing Goal: Afebrile, Vital Signs remain stable Outcome: Completed/Met Date Met:  01/01/14

## 2014-01-01 NOTE — Progress Notes (Signed)
PROGRESS NOTE  Brett Mckee UJW:[id number] DOB: 12/22/1926 DOA: 12/29/2013 PCP: Darnelle BosSBORNE,JAMES CHARLES, MD  Assessment/Plan: Sepsis -Present at the time of admission -Secondary to UTI and bacteremia -Fever and WBC is improved -continue IV Zosyn pending culture data CKD stage III -Baseline creatinine 1.1-1.5 -monitor serum creatinine with the patient back on furosemide Dysphagia/aspiration pneumonia -suspect the patient's dyspnea was resulting from the patient's aspiration -speech therapist recommends Dys 2 diet with nectar thickened -CXR--no infiltrates, chronic bronchitic changes -restart lasix -presently stable on room air Bacteremia--Escherichia coli -Source is likely urine -Continue IV Zosyn pending culture data Although suspect isolate will be Escherichia coli UTI -Escherichia coli Acute encephalopathy -Secondary to infectious process -Patient has baseline dementia -Continue Aricept Hyperglycemia -Hemoglobin A1c--9.2 -given the patient's age and life expectancy, I favor more liberal control of his CBGs -At the time of discharge, I do not plan to place the patient on insulin Presacral erythema -no skin break down but [facility] wants WOCN Deconditioning/generalized weakness -PT eval and treat -Will likely need skilled nursing facility which daughter agrees Right knee pain -X-ray right knee  Family Communication:   Daughter updated on phone Disposition Plan:   Likely SNF       Procedures/Studies: Dg Chest 2 View  12/29/2013   CLINICAL DATA:  Fever.  Initial encounter  EXAM: CHEST  2 VIEW  COMPARISON:  12/01/2012  FINDINGS: Significantly limited study due to hypoaeration.  Chronic cardiomegaly, aortic tortuosity, and vascular pedicle widening. No overt edema, asymmetric opacity, or definitive effusion. No pneumothorax.  IMPRESSION: Very low lung volumes, which limits sensitivity. No definitive pneumonia.   Electronically Signed   By: Tiburcio PeaJonathan   Watts M.D.   On: 12/29/2013 13:38   Ct Chest Wo Contrast  12/29/2013   CLINICAL DATA:  Cough and shortness of breath.  EXAM: CT CHEST WITHOUT CONTRAST  TECHNIQUE: Multidetector CT imaging of the chest was performed following the standard protocol without IV contrast.  COMPARISON:  None.  FINDINGS: THORACIC INLET/BODY WALL:  No acute abnormality.  MEDIASTINUM:  Cardiomegaly. No pericardial effusion. Aortic and great vessel atherosclerosis. No evidence of acute vascular abnormality. No lymphadenopathy. Negative esophagus.  LUNG WINDOWS:  Very low lung volumes with mild basilar atelectasis. There is no edema, effusion, pneumothorax, or definitive pneumonia. As permitted by patient respiratory motion, no definite pulmonary nodule. There is tiny calcifications in the peripheral lower lobes bilaterally.  UPPER ABDOMEN:  Coarse curvilinear calcification in the region of the gallbladder fundus. This is most likely gallbladder wall calcification (which is associated with increased risk of cholangiocarcinoma), although a peripherally calcified, exophytic liver cyst or a calcified gallstone are diagnostic possibilities. There is a homogeneous, minimally imaged lesion in the left pararenal area which is likely a exophytic cyst. Hepatic steatosis.  OSSEOUS:  No acute fracture.  No suspicious lytic or blastic lesions.  IMPRESSION: 1. Negative for pneumonia. 2. Low lung volumes with bilateral subsegmental atelectasis. 3. Calcification in the gallbladder fossa which favors porcelain gallbladder over gallstone.   Electronically Signed   By: Tiburcio PeaJonathan  Watts M.D.   On: 12/29/2013 16:20   Dg Chest Port 1 View  12/31/2013   CLINICAL DATA:  Wheezing, shortness of Breath  EXAM: PORTABLE CHEST - 1 VIEW  COMPARISON:  12/29/2013  FINDINGS: Cardiomediastinal silhouette is stable. Again noted elevation of the right hemidiaphragm with linear atelectasis right base. Question central mild bronchitic changes. No segmental infiltrate. Mild  left basilar atelectasis.  IMPRESSION: No segmental infiltrate. Question  central mild bronchitic changes. Mild basilar atelectasis. Chronic elevation of the right hemidiaphragm.   Electronically Signed   By: Natasha Mead M.D.   On: 12/31/2013 11:51         Subjective: Pt is more alert today. He remains pleasant and confused. Patient denies any chest pain, shortness breath, abdominal pain, vomiting, diarrhea.  Objective: Filed Vitals:   12/31/13 2100 01/01/14 0154 01/01/14 0422 01/01/14 0841  BP: 141/66  156/71   Pulse: 82  94   Temp: 99.3 F (37.4 C)  97.8 F (36.6 C)   TempSrc: Oral  Oral   Resp: 20  20   Height:      Weight:      SpO2: 94% 95% 95% 94%    Intake/Output Summary (Last 24 hours) at 01/01/14 1359 Last data filed at 01/01/14 0912  Gross per 24 hour  Intake    630 ml  Output   1250 ml  Net   -620 ml   Weight change:  Exam:   General:  Pt is alert, follows commands appropriately, not in acute distress  HEENT: No icterus, No thrush, [place]/AT  Cardiovascular: RRR, S1/S2, no rubs, no gallops  Respiratory: bibasilar crackles, right greater than left. No wheezing.  Abdomen: Soft/+BS, non tender, non distended, no guarding  Extremities: trace LE edema, No lymphangitis, No petechiae, No rashes, no synovitis  Data Reviewed: Basic Metabolic Panel:  Recent Labs Lab 12/29/13 1219 12/30/13 0555 12/31/13 0518 01/01/14 0328  NA 145 146 143 140  K 4.1 4.4 4.5 4.1  CL 105 110 107 100  CO2 26 26 25 23   GLUCOSE 255* 249* 277* 305*  BUN 30* 29* 26* 29*  CREATININE 1.41* 1.21 1.15 1.51*  CALCIUM 9.3 8.8 9.4 9.4   Liver Function Tests:  Recent Labs Lab 12/29/13 1219  AST 19  ALT 24  ALKPHOS 72  BILITOT 0.4  PROT 7.3  ALBUMIN 2.9*   No results for input(s): LIPASE, AMYLASE in the last 168 hours. No results for input(s): AMMONIA in the last 168 hours. CBC:  Recent Labs Lab 12/29/13 1219 12/30/13 0555 12/31/13 0518  WBC 14.9* 13.4* 8.7  NEUTROABS  14.5*  --   --   HGB 14.2 13.5 13.7  HCT 44.8 42.2 42.8  MCV 92.2 91.3 91.1  PLT 201 187 199   Cardiac Enzymes: No results for input(s): CKTOTAL, CKMB, CKMBINDEX, TROPONINI in the last 168 hours. BNP: Invalid input(s): POCBNP CBG: No results for input(s): GLUCAP in the last 168 hours.  Recent Results (from the past 240 hour(s))  Blood Culture (routine x 2)     Status: None (Preliminary result)   Collection Time: 12/29/13 12:15 PM  Result Value Ref Range Status   Specimen Description BLOOD RIGHT HAND  Final   Special Requests BOTTLES DRAWN AEROBIC ONLY 4CCS  Final   Culture  Setup Time   Final    12/29/2013 17:18 Performed at [facility]    Culture   Final    ESCHERICHIA COLI Note: Gram Stain Report Called to,Read Back By and Verified With: Burnett Corrente ON 12/30/2013 AT 10:40P BY WILEJ Performed at [facility]    Report Status PENDING  Incomplete  Blood Culture (routine x 2)     Status: None (Preliminary result)   Collection Time: 12/29/13 12:20 PM  Result Value Ref Range Status   Specimen Description BLOOD RIGHT ANTECUBITAL  Final   Special Requests BOTTLES DRAWN AEROBIC AND ANAEROBIC  Final   Culture  Setup Time   Final    12/29/2013 17:18 [facility]formed at [facility]    Culture   Final           BLOOD CULTURE RECEIVED NO GROWTH TO DATE CULTURE WILL BE HELD FOR 5 DAYS BEFORE ISSUING A FINAL NEGATIVE REPORT [facility]formed at [facility]    Report Status PENDING  Incomplete  Urine culture     Status: None   Collection Time: 12/29/13 12:31 PM  Result Value Ref Range Status   Specimen Description URINE, CATHETERIZED  Final   Special Requests NONE  Final   Culture  Setup Time   Final    12/29/2013 17:21 [facility]formed at [facility]    Colony Count   Final    80,000 COLONIES/ML Performed at [facility]    Culture   Final    ESCHERICHIA COLI Performed at [facility]    Report Status 12/31/2013 FINAL  Final    Organism ID, Bacteria ESCHERICHIA COLI  Final      Susceptibility   Escherichia coli - MIC*    AMPICILLIN <=2 SENSITIVE Sensitive     CEFAZOLIN <=4 SENSITIVE Sensitive     CEFTRIAXONE <=1 SENSITIVE Sensitive     CIPROFLOXACIN <=0.25 SENSITIVE Sensitive     GENTAMICIN <=1 SENSITIVE Sensitive     LEVOFLOXACIN <=0.12 SENSITIVE Sensitive     NITROFURANTOIN <=16 SENSITIVE Sensitive     TOBRAMYCIN <=1 SENSITIVE Sensitive     TRIMETH/SULFA <=20 SENSITIVE Sensitive     PIP/TAZO <=4 SENSITIVE Sensitive     * ESCHERICHIA COLI  MRSA PCR Screening     Status: None   Collection Time: 12/29/13  7:43 PM  Result Value Ref Range Status   MRSA by PCR NEGATIVE NEGATIVE Final    Comment:        The GeneXpert MRSA Assay (FDA approved for NASAL specimens only), is one component of a comprehensive MRSA colonization surveillance program. It is not intended to diagnose MRSA infection nor to guide or monitor treatment for MRSA infections.      Scheduled Meds: . albuterol  2.5 mg Nebulization Q6H  . antiseptic oral rinse  7 mL Mouth Rinse BID  . donepezil  5 mg Oral Daily  . furosemide  40 mg Oral Daily  . heparin  5,000 Units Subcutaneous 3 times per day  . insulin aspart  0-5 Units Subcutaneous QHS  . insulin aspart  0-9 Units Subcutaneous TID WC  . piperacillin-tazobactam (ZOSYN)  IV  3.375 g Intravenous 3 times per day  . sodium chloride  3 mL Intravenous Q12H   Continuous Infusions:    Eligh Rybacki, DO  [facility] Pager [phone]  If 7PM-7AM, please contact night-coverage [email or web address] Password [email or web address] 01/01/2014, 1:59 PM   LOS: 3 days

## 2014-01-01 NOTE — Care Management Note (Signed)
    Page 1 of 1   01/02/2014     5:08:54 PM CARE MANAGEMENT NOTE 01/02/2014  Patient:  Brett Mckee,Brett Mckee   Account Number:  [id number]  Date Initiated:  12/29/2013  Documentation initiated by:  [facility]ALESIA  Subjective/Objective Assessment:   UTI  pt from [facility]     Action/Plan:   pt eval-rec snf,   Anticipated DC Date:  01/02/2014   Anticipated DC Plan:  SKILLED NURSING FACILITY  In-house referral  Clinical Social Worker      DC Planning Services  CM consult      Choice offered to / List presented to:             Status of service:  Completed, signed off Medicare Important Message given?  YES (If response is "NO", the following Medicare IM given date fields will be blank) Date Medicare IM given:  01/01/2014 Medicare IM given by:  Letha CapeAYLOR,Zarra Geffert Date Additional Medicare IM given:   Additional Medicare IM given by:    Discharge Disposition:  SKILLED NURSING FACILITY  Per UR Regulation:  Reviewed for med. necessity/level of care/duration of stay  If discussed at Long Length of Stay Meetings, dates discussed:    Comments:  12/29/2013 1830 UR completed. Chart reviewed completed. Isidoro DonningAlesia Shavis RN CCM Case Mgmt phone [phone]

## 2014-01-01 NOTE — Evaluation (Signed)
Physical Therapy Evaluation Patient Details Name: Brett Mckee MRN: [id number] DOB: 08/19/1926 Today's Date: 01/01/2014   History of Present Illness   Brett Mckee is a 80 y.o. male with Alzheimer's dementia, BPH and hyperlipidemia who is sent from the nursing facility for confusion which per daughter began yesterday. He is found to have a fever and a UTI in the ER. Per daughter, other than being more confused, he has been able to perform his usual activities. He has never had a UTI in the past and is not currently on treatment for BPH. The patient is pleasantly confused and has no complaints  Clinical Impression  Pt admitted with/for confusion due to UTI.  Pt currently limited functionally due to the problems listed. ( See problems list.)   Pt will benefit from PT to maximize function and safety in order to get ready for next venue listed below.     Follow Up Recommendations SNF    Equipment Recommendations  Other (comment) (TBA)    Recommendations for Other Services       Precautions / Restrictions Precautions Precautions: Fall      Mobility  Bed Mobility Overal bed mobility: Needs Assistance Bed Mobility: Sidelying to Sit   Sidelying to sit: Mod assist       General bed mobility comments: Maximal assist to move to EOB due to painful R leg, but light mod assist to sit up.  Transfers Overall transfer level: Needs assistance   Transfers: Sit to/from Stand;Stand Pivot Transfers Sit to Stand: Min assist;+2 safety/equipment Stand pivot transfers: Min assist;+2 safety/equipment       General transfer comment: step by step cues and assist for transfer  Ambulation/Gait             General Gait Details: did not ambulate  Stairs            Wheelchair Mobility    Modified Rankin (Stroke Patients Only)       Balance Overall balance assessment: Needs assistance Sitting-balance support: No upper extremity supported;Single extremity  supported Sitting balance-Leahy Scale: Fair     Standing balance support: Bilateral upper extremity supported;Single extremity supported Standing balance-Leahy Scale: Poor Standing balance comment: pt needed assistive device, but may be able to stand unassisted at baseline mentation.                             Pertinent Vitals/Pain Pain Assessment: Faces Faces Pain Scale: Hurts even more Pain Location: R leg pain Pain Intervention(s): Monitored during session;Limited activity within patient's tolerance    Home Living Family/patient expects to be discharged to:: Assisted living                 Additional Comments: No family around to ask about PLOF    Prior Function                 Hand Dominance        Extremity/Trunk Assessment               Lower Extremity Assessment: Difficult to assess due to impaired cognition;RLE deficits/detail;LLE deficits/detail RLE Deficits / Details: painful with assisted mobility.  When assisted pt resisted movement  grossly 3+/5 based on standing LLE Deficits / Details: grossly 3+/5 based on standing     Communication   Communication: No difficulties  Cognition Arousal/Alertness: Awake/alert Behavior During Therapy: WFL for tasks assessed/performed Overall Cognitive Status: History of cognitive impairments - at baseline  General Comments      Exercises        Assessment/Plan    PT Assessment Patient needs continued PT services  PT Diagnosis Generalized weakness;Altered mental status   PT Problem List Decreased strength;Decreased activity tolerance;Decreased balance;Decreased mobility;Decreased knowledge of use of DME;Decreased cognition;Pain  PT Treatment Interventions DME instruction;Gait training;Functional mobility training;Therapeutic activities;Balance training;Patient/family education   PT Goals (Current goals can be found in the Care Plan section) Acute Rehab PT  Goals Patient Stated Goal: pt unable to participate in goal setting PT Goal Formulation: With patient Time For Goal Achievement: 01/08/14 Potential to Achieve Goals: Good    Frequency Min 2X/week   Barriers to discharge        Co-evaluation               End of Session   Activity Tolerance: Patient tolerated treatment well;Patient limited by pain Patient left: in chair;with call bell/phone within reach;with nursing/sitter in room Nurse Communication: Mobility status         Time: [id number] PT Time Calculation (min) (ACUTE ONLY): 16 min   Charges:   PT Evaluation $Initial PT Evaluation Tier I: 1 Procedure PT Treatments $Therapeutic Activity: 8-22 mins   PT G Codes:          Nyjah Schwake, Eliseo GumKenneth V 01/01/2014, 2:42 PM  01/01/2014   BingKen Nathyn Luiz, PT [phone]  (pager)

## 2014-01-01 NOTE — Progress Notes (Signed)
CRITICAL VALUE ALERT  Critical value received:  439  Date of notification:  01/01/2014   Time of notification:  1825  Critical value read back: yes  Nurse who received alert:  Madelin RearLonnie Mallarie Voorhies, MSN, RN, CMSRN  MD notified (1st page):  TAT  Time of first page:  1825  MD notified (2nd page):  Time of second page:  Responding MD:  TAT  Time MD responded:  [phone]

## 2014-01-01 NOTE — Progress Notes (Signed)
Inpatient Diabetes Program Recommendations  AACE/ADA: New Consensus Statement on Inpatient Glycemic Control (2013)  Target Ranges:  Prepandial:   less than 140 mg/dL      Peak postprandial:   less than 180 mg/dL (1-2 hours)      Critically ill patients:  140 - 180 mg/dL   [id number] at 0.4%9.2%. No history of dm documented. Please check cbg's tidwc and HS using the sensitive or moderate tidwc and the HS scale please.   Thank you, Lenor CoffinAnn Laneya Gasaway, RN, CNS, Diabetes Coordinator [phone])

## 2014-01-01 NOTE — Clinical Social Work Note (Signed)
CSW spoke with patient's daughter regarding the possible need for higher level of care (patient currently lives at ALF memory care). Daughter agreeable to SNF plan as she also feels he will likely need a higher level of care at discharge. CSW will follow up with available bed offers once available.    Brett Mckee MSW, [place]CSWA, [place], [phone]

## 2014-01-01 NOTE — Clinical Social Work Placement (Signed)
Clinical Social Work Department CLINICAL SOCIAL WORK PLACEMENT NOTE 01/01/2014  Patient:  Brett Mckee,Brett Mckee  Account Number:  [id number] Admit date:  12/29/2013  Clinical Social Worker:  Cherre BlancJOSEPH BRYANT [name], [place]WA  Date/time:  01/01/2014 02:53 PM  Clinical Social Work is seeking post-discharge placement for this patient at the following level of care:   SKILLED NURSING   (*CSW will update this form in Epic as items are completed)   01/01/2014  [facility]family provided with [facility] of [facility] Work's list of facilities offering this level of care within the geographic area requested by the patient (or if unable, by the patient's family).  01/01/2014  Patient/family informed of their freedom to choose among providers that offer the needed level of care, that participate in Medicare, Medicaid or managed care program needed by the patient, have an available bed and are willing to accept the patient.  01/01/2014  Patient/family informed of [facility]' ownership interest in [facility], as well as of the fact that they are under no obligation to receive care at this facility.  PASARR submitted to EDS on 01/01/2014 PASARR number received on 01/01/2014  FL2 transmitted to all facilities in geographic area requested by pt/family on  01/01/2014 FL2 transmitted to all facilities within larger geographic area on   Patient informed that his/her managed care company has contracts with or will negotiate with  certain facilities, including the following:     Patient/family informed of bed offers received:  01/01/2014 Patient chooses bed at  Physician recommends and patient chooses bed at    Patient to be transferred to  on   Patient to be transferred to facility by  Patient and family notified of transfer on  Name of family member notified:    The following physician request were entered in Epic:   Additional Comments:    Roddie McBryant Lota Leamer MSW,  [place]CSWA, [place], [phone]

## 2014-01-02 LAB — CULTURE, BLOOD (ROUTINE X 2)

## 2014-01-02 LAB — CBC
HCT: 46.2 % (ref 39.0–52.0)
Hemoglobin: 15.2 g/dL (ref 13.0–17.0)
MCH: 29.6 pg (ref 26.0–34.0)
MCHC: 32.9 g/dL (ref 30.0–36.0)
MCV: 90.1 fL (ref 78.0–100.0)
PLATELETS: 257 10*3/uL (ref 150–400)
RBC: 5.13 MIL/uL (ref 4.22–5.81)
RDW: 13.6 % (ref 11.5–15.5)
WBC: 8.7 10*3/uL (ref 4.0–10.5)

## 2014-01-02 LAB — BASIC METABOLIC PANEL
ANION GAP: 13 (ref 5–15)
BUN: 29 mg/dL — ABNORMAL HIGH (ref 6–23)
CO2: 28 mEq/L (ref 19–32)
Calcium: 9.9 mg/dL (ref 8.4–10.5)
Chloride: 100 mEq/L (ref 96–112)
Creatinine, Ser: 1.48 mg/dL — ABNORMAL HIGH (ref 0.50–1.35)
GFR, EST AFRICAN AMERICAN: 48 mL/min — AB (ref >90)
GFR, EST NON AFRICAN AMERICAN: 41 mL/min — AB (ref >90)
Glucose, Bld: 215 mg/dL — ABNORMAL HIGH (ref 70–99)
POTASSIUM: 4.2 meq/L (ref 3.7–5.3)
SODIUM: 141 meq/L (ref 137–147)

## 2014-01-02 LAB — GLUCOSE, CAPILLARY
Glucose-Capillary: 201 mg/dL — ABNORMAL HIGH (ref 70–99)
Glucose-Capillary: 221 mg/dL — ABNORMAL HIGH (ref 70–99)
Glucose-Capillary: 333 mg/dL — ABNORMAL HIGH (ref 70–99)

## 2014-01-02 MED ORDER — RESOURCE THICKENUP CLEAR PO POWD: ORAL | Status: DC

## 2014-01-02 MED ORDER — LEVOFLOXACIN 500 MG PO TABS
500.0000 mg | ORAL_TABLET | Freq: Every day | ORAL | Status: DC
Start: 2014-01-02 — End: 2014-01-02
  Administered 2014-01-02: 500 mg via ORAL
  Filled 2014-01-02: qty 1

## 2014-01-02 MED ORDER — LEVOFLOXACIN 500 MG PO TABS: 500.0000 mg | ORAL_TABLET | Freq: Every day | ORAL | Status: AC

## 2014-01-02 MED ORDER — LEVOFLOXACIN 500 MG PO TABS: 500.0000 mg | ORAL_TABLET | Freq: Every day | ORAL | Status: DC

## 2014-01-02 MED ORDER — GLIPIZIDE 5 MG PO TABS
5.0000 mg | ORAL_TABLET | Freq: Every day | ORAL | Status: DC
Start: 2014-01-02 — End: 2014-01-17

## 2014-01-02 NOTE — Discharge Summary (Signed)
Physician Discharge Summary  Brett FastRobert Delbert Mckee ZOX:[id number] DOB: 05/25/1926 DOA: 12/29/2013  PCP: Darnelle BosSBORNE,JAMES CHARLES, MD  Admit date: 12/29/2013 Discharge date: 01/02/2014  Recommendations for Outpatient Follow-up:  1. Pt will need to follow up with PCP in 2 weeks post discharge 2. BMP in one week   Discharge Diagnoses:  Sepsis -Present at the time of admission -Secondary to UTI and bacteremia--EColi -Fever and WBC is improved -continue IV Zosyn pending culture data CKD stage III -Baseline creatinine 1.1-1.5 -monitor serum creatinine with the patient back on furosemide -serum creatinine 1.48 on the day of discharge Dysphagia/aspiration pneumonia -suspect the patient's dyspnea was resulting from the patient's aspiration -speech therapist recommends Dys 2 diet with nectar thickened -CXR--no infiltrates, chronic bronchitic changes -restart lasix -presently stable on room air Bacteremia--Escherichia coli -Source is likely urine -Continue IV Zosyn pending culture data Although suspect isolate will be Escherichia coli -the patient will go home with an additional dose of levofloxacin which will complete 14 days of therapy UTI -Escherichia coli Acute encephalopathy -Secondary to infectious process -Patient has baseline dementia -Continue Aricept -the patient returned back to baseline prior to discharge Hyperglycemia -Hemoglobin A1c--9.2 -given the patient's age and life expectancy, I favor more liberal control of his CBGs -At the time of discharge, I do not plan to place the patient on insulin -the patient will be discharged with glipizide 5 mg daily Presacral erythema -no skin break down but [facility] wants WOCN -wound care nursesulfa patient and recommended. Cream to protect his skin erythema moisture. Deconditioning/generalized weakness -PT eval and treat--recommended skilled nursing facility -however, the patient's son felt that given his dementia, the patient  would be best served going back to a familiar environment at [facility] -With the assistance of social work, [facility] was willing to take the patient back Right knee pain -X-ray right knee--negative for fracture or dislocation   Disposition Plan: [facility]  Discharge Condition: stable   Diet:dysphagia 2 with nectar thicken Wt Readings from Last 3 Encounters:  01/02/14 95.165 kg (209 lb 12.8 oz)  12/03/12 98.5 kg (217 lb 2.5 oz)    History of present illness:  80 yo male with Alzheimer's dementia sent to the ED from nursing facility for confusion. Pt was found to have a fever and UTI. Daughter reports pt has been able to perform his normal activities but has been more confused.blood cultures were obtained and ultimately grew Escherichia coli. The patient was initially started on ceftriaxone. He was converted to Zosyn due to concerns for aspiration. [name] WBC improved. He remained hemodynamically stable. He remained clinically stable on room air. The patient will change his into levofloxacin. He will complete 10 additional days of levofloxacin to finish 14 days of therapy. His renal function remained stable throughout the hospitalization. His baseline creatinine was 1.1-1.5.    Discharge Exam: Filed Vitals:   01/02/14 0606  BP: 154/78  Pulse: 89  Temp: 97.5 F (36.4 C)  Resp: 24   Filed Vitals:   01/01/14 2046 01/01/14 2136 01/02/14 0606 01/02/14 1027  BP:  119/86 154/78   Pulse:  89 89   Temp:  97.6 F (36.4 C) 97.5 F (36.4 C)   TempSrc:   Oral   Resp:  16 24   Height:      Weight:   95.165 kg (209 lb 12.8 oz)   SpO2: 96% 96% 95% 93%   General: Awake and alert, NAD, pleasant, cooperative Cardiovascular: RRR, no rub, no gallop, no S3 Respiratory: bibasilar crackles.  No wheezing. Abdomen:soft, nontender, nondistended, positive bowel sounds Extremities: 1 +LE edema, No lymphangitis, no petechiae  Discharge Instructions      Discharge  Instructions    Diet - low sodium heart healthy    Complete by:  As directed      Increase activity slowly    Complete by:  As directed             Medication List    TAKE these medications        acetaminophen 500 MG tablet  Commonly known as:  TYLENOL  Take 1,000 mg by mouth 2 (two) times daily.     BAZA PROTECT EX  Apply 1 application topically as needed (Apply to affected area after each incontinent episode. Notify MD if symptoms persist over a week.).     donepezil 10 MG tablet  Commonly known as:  ARICEPT  Take 5 mg by mouth daily.     furosemide 40 MG tablet  Commonly known as:  LASIX  Take 40 mg by mouth daily.     glipiZIDE 5 MG tablet  Commonly known as:  GLUCOTROL  Take 1 tablet (5 mg total) by mouth daily before breakfast.     levofloxacin 500 MG tablet  Commonly known as:  LEVAQUIN  Take 1 tablet (500 mg total) by mouth daily.     LORazepam 0.5 MG tablet  Commonly known as:  ATIVAN  Take 0.5 mg by mouth every 6 (six) hours as needed (for anxiety / agitation).     Melatonin 3 MG Tabs  Take 3 mg by mouth at bedtime.     potassium chloride SA 20 MEQ tablet  Commonly known as:  K-DUR,KLOR-CON  Take 20 mEq by mouth daily.     ranitidine 75 MG tablet  Commonly known as:  ZANTAC  Take 150 mg by mouth daily.     RESOURCE THICKENUP CLEAR Powd  Use as needed to thicken thin liquids for swallowing         The results of significant diagnostics from this hospitalization (including imaging, microbiology, ancillary and laboratory) are listed below for reference.    Significant Diagnostic Studies: Dg Chest 2 View  12/29/2013   CLINICAL DATA:  Fever.  Initial encounter  EXAM: CHEST  2 VIEW  COMPARISON:  12/01/2012  FINDINGS: Significantly limited study due to hypoaeration.  Chronic cardiomegaly, aortic tortuosity, and vascular pedicle widening. No overt edema, asymmetric opacity, or definitive effusion. No pneumothorax.  IMPRESSION: Very low lung volumes,  which limits sensitivity. No definitive pneumonia.   Electronically Signed   By: Tiburcio Pea M.D.   On: 12/29/2013 13:38   Dg Knee 1-2 Views Right  01/01/2014   CLINICAL DATA:  Right knee pain  EXAM: RIGHT KNEE - 1-2 VIEW  COMPARISON:  None.  FINDINGS: Two views of the right knee submitted. Significant diffuse narrowing of joint space. Mild spurring of femoral condyles. Mild spurring of tibial plateau. Significant narrowing of patellofemoral joint space. Spurring of patella. No acute fracture or subluxation. Small joint effusion.  IMPRESSION: No acute fracture or subluxation. Significant osteoarthritic changes.   Electronically Signed   By: Natasha Mead M.D.   On: 01/01/2014 16:28   Ct Chest Wo Contrast  12/29/2013   CLINICAL DATA:  Cough and shortness of breath.  EXAM: CT CHEST WITHOUT CONTRAST  TECHNIQUE: Multidetector CT imaging of the chest was performed following the standard protocol without IV contrast.  COMPARISON:  None.  FINDINGS: THORACIC INLET/BODY WALL:  No acute  abnormality.  MEDIASTINUM:  Cardiomegaly. No pericardial effusion. Aortic and great vessel atherosclerosis. No evidence of acute vascular abnormality. No lymphadenopathy. Negative esophagus.  LUNG WINDOWS:  Very low lung volumes with mild basilar atelectasis. There is no edema, effusion, pneumothorax, or definitive pneumonia. As permitted by patient respiratory motion, no definite pulmonary nodule. There is tiny calcifications in the peripheral lower lobes bilaterally.  UPPER ABDOMEN:  Coarse curvilinear calcification in the region of the gallbladder fundus. This is most likely gallbladder wall calcification (which is associated with increased risk of cholangiocarcinoma), although a peripherally calcified, exophytic liver cyst or a calcified gallstone are diagnostic possibilities. There is a homogeneous, minimally imaged lesion in the left pararenal area which is likely a exophytic cyst. Hepatic steatosis.  OSSEOUS:  No acute fracture.   No suspicious lytic or blastic lesions.  IMPRESSION: 1. Negative for pneumonia. 2. Low lung volumes with bilateral subsegmental atelectasis. 3. Calcification in the gallbladder fossa which favors porcelain gallbladder over gallstone.   Electronically Signed   By: Tiburcio PeaJonathan  Watts M.D.   On: 12/29/2013 16:20   Dg Chest Port 1 View  12/31/2013   CLINICAL DATA:  Wheezing, shortness of Breath  EXAM: PORTABLE CHEST - 1 VIEW  COMPARISON:  12/29/2013  FINDINGS: Cardiomediastinal silhouette is stable. Again noted elevation of the right hemidiaphragm with linear atelectasis right base. Question central mild bronchitic changes. No segmental infiltrate. Mild left basilar atelectasis.  IMPRESSION: No segmental infiltrate. Question central mild bronchitic changes. Mild basilar atelectasis. Chronic elevation of the right hemidiaphragm.   Electronically Signed   By: Natasha MeadLiviu  Pop M.D.   On: 12/31/2013 11:51     Microbiology: Recent Results (from the past 240 hour(s))  Blood Culture (routine x 2)     Status: None   Collection Time: 12/29/13 12:15 PM  Result Value Ref Range Status   Specimen Description BLOOD RIGHT HAND  Final   Special Requests BOTTLES DRAWN AEROBIC ONLY 4CCS  Final   Culture  Setup Time   Final    12/29/2013 17:18 Performed at [facility]    Culture   Final    ESCHERICHIA COLI Note: Gram Stain Report Called to,Read Back By and Verified With: Burnett CorrenteVIVIAN BARRERA ON 12/30/2013 AT 10:40P BY WILEJ Performed at [facility]    Report Status 01/02/2014 FINAL  Final   Organism ID, Bacteria ESCHERICHIA COLI  Final      Susceptibility   Escherichia coli - MIC*    AMPICILLIN <=2 SENSITIVE Sensitive     AMPICILLIN/SULBACTAM <=2 SENSITIVE Sensitive     CEFAZOLIN <=4 SENSITIVE Sensitive     CEFEPIME <=1 SENSITIVE Sensitive     CEFTAZIDIME <=1 SENSITIVE Sensitive     CEFTRIAXONE <=1 SENSITIVE Sensitive     CIPROFLOXACIN <=0.25 SENSITIVE Sensitive     GENTAMICIN <=1 SENSITIVE Sensitive      IMIPENEM <=0.25 SENSITIVE Sensitive     PIP/TAZO <=4 SENSITIVE Sensitive     TOBRAMYCIN <=1 SENSITIVE Sensitive     TRIMETH/SULFA <=20 SENSITIVE Sensitive     * ESCHERICHIA COLI  Blood Culture (routine x 2)     Status: None (Preliminary result)   Collection Time: 12/29/13 12:20 PM  Result Value Ref Range Status   Specimen Description BLOOD RIGHT ANTECUBITAL  Final   Special Requests BOTTLES DRAWN AEROBIC AND ANAEROBIC 10MLS  Final   Culture  Setup Time   Final    12/29/2013 17:18 Performed at [facility]    Culture   Final  BLOOD CULTURE RECEIVED NO GROWTH TO DATE CULTURE WILL BE HELD FOR 5 DAYS BEFORE ISSUING A FINAL NEGATIVE REPORT Performed at [facility]    Report Status PENDING  Incomplete  Urine culture     Status: None   Collection Time: 12/29/13 12:31 PM  Result Value Ref Range Status   Specimen Description URINE, CATHETERIZED  Final   Special Requests NONE  Final   Culture  Setup Time   Final    12/29/2013 17:21 Performed at [facility] Count   Final    80,000 COLONIES/ML Performed at [facility]    Culture   Final    ESCHERICHIA COLI Performed at [facility]    Report Status 12/31/2013 FINAL  Final   Organism ID, Bacteria ESCHERICHIA COLI  Final      Susceptibility   Escherichia coli - MIC*    AMPICILLIN <=2 SENSITIVE Sensitive     CEFAZOLIN <=4 SENSITIVE Sensitive     CEFTRIAXONE <=1 SENSITIVE Sensitive     CIPROFLOXACIN <=0.25 SENSITIVE Sensitive     GENTAMICIN <=1 SENSITIVE Sensitive     LEVOFLOXACIN <=0.12 SENSITIVE Sensitive     NITROFURANTOIN <=16 SENSITIVE Sensitive     TOBRAMYCIN <=1 SENSITIVE Sensitive     TRIMETH/SULFA <=20 SENSITIVE Sensitive     PIP/TAZO <=4 SENSITIVE Sensitive     * ESCHERICHIA COLI  MRSA PCR Screening     Status: None   Collection Time: 12/29/13  7:43 PM  Result Value Ref Range Status   MRSA by PCR NEGATIVE NEGATIVE Final    Comment:        The  GeneXpert MRSA Assay (FDA approved for NASAL specimens only), is one component of a comprehensive MRSA colonization surveillance program. It is not intended to diagnose MRSA infection nor to guide or monitor treatment for MRSA infections.      Labs: Basic Metabolic Panel:  Recent Labs Lab 12/29/13 1219 12/30/13 0555 12/31/13 0518 01/01/14 0328 01/02/14 0535  NA 145 146 143 140 141  K 4.1 4.4 4.5 4.1 4.2  CL 105 110 107 100 100  CO2 26 26 25 23 28   GLUCOSE 255* 249* 277* 305* 215*  BUN 30* 29* 26* 29* 29*  CREATININE 1.41* 1.21 1.15 1.51* 1.48*  CALCIUM 9.3 8.8 9.4 9.4 9.9   Liver Function Tests:  Recent Labs Lab 12/29/13 1219  AST 19  ALT 24  ALKPHOS 72  BILITOT 0.4  PROT 7.3  ALBUMIN 2.9*   No results for input(s): LIPASE, AMYLASE in the last 168 hours. No results for input(s): AMMONIA in the last 168 hours. CBC:  Recent Labs Lab 12/29/13 1219 12/30/13 0555 12/31/13 0518 01/02/14 0535  WBC 14.9* 13.4* 8.7 8.7  NEUTROABS 14.5*  --   --   --   HGB 14.2 13.5 13.7 15.2  HCT 44.8 42.2 42.8 46.2  MCV 92.2 91.3 91.1 90.1  PLT 201 187 199 257   Cardiac Enzymes: No results for input(s): CKTOTAL, CKMB, CKMBINDEX, TROPONINI in the last 168 hours. BNP: Invalid input(s): POCBNP CBG:  Recent Labs Lab 01/01/14 1809 01/01/14 1821 01/01/14 2133 01/02/14 0639 01/02/14 0801  GLUCAP 440* 439* 473* 201* 221*    Time coordinating discharge:  Greater than 30 minutes  Signed:  Taji Sather, DO [facility] Pager: [phone] 01/02/2014, 11:55 AM

## 2014-01-02 NOTE — Plan of Care (Signed)
Problem: Phase III Progression Outcomes Goal: IV Medications to PO Outcome: Progressing     

## 2014-01-02 NOTE — Progress Notes (Signed)
Speech Language Pathology Treatment: Dysphagia  Patient Details Name: Brett Mckee MRN: [id number] DOB: 03-31-1926 Today's Date: 01/02/2014 Time: 1416-1430 SLP Time Calculation (min) (ACUTE ONLY): 14 min  Assessment / Plan / Recommendation Clinical Impression  Treatment focused on education of swallow status and recommendations with daughter.  SLP  Demonstrated proper thickening to nectar consistency and recommendations for Dys 2 texture with follow up home health at memory care/ALF for safety and upgrade when/if appropriate.  No difficulties with sips nectar thick.  Pt. Scheduled for d/c today to ALF.   HPI HPI: 80 yo male with Alzheimer's dementia sent to the ED from nursing facility for confusion. Pt was found to have a fever and UTI. Daughter reports pt has been able to perform his normal activities but has been more confused. No hx of UTI. PMH of BPH, Alzheimer's dementia, and hyperlipidemia. Pt reportedly choked on lunch 12/31/13 and has had increased wheezing since that time.   Pertinent Vitals Pain Assessment: No/denies pain  SLP Plan  All goals met    Recommendations Diet recommendations: Dysphagia 2 (fine chop);Nectar-thick liquid Liquids provided via: Cup;No straw Medication Administration: Whole meds with puree Supervision: Patient able to self feed;Full supervision/cueing for compensatory strategies Compensations: Slow rate;Small sips/bites Postural Changes and/or Swallow Maneuvers: Seated upright 90 degrees              Oral Care Recommendations: Oral care BID Follow up Recommendations: Home health SLP Plan: All goals met    GO     Houston Siren 01/02/2014, 2:55 PM   Orbie Pyo Colvin Caroli.Ed [employer] [phone]

## 2014-01-02 NOTE — Clinical Social Work Note (Signed)
Per MD patient ready to DC back to [facility]. RN, patient/family, and facility notified of patient's DC. RN given number for report (on DC packet). DC packet on patient's chart. Ambulance transport requested for patient for 3:30pm (CSW called Rick with [facility]). CSW signing off at this time.   Roddie McBryant Avaeh Ewer MSW, LaceyLCSWA, RuthLCASA, [phone]

## 2014-01-02 NOTE — Progress Notes (Signed)
Echocardiogram 2D Echocardiogram has been performed.  Brett Mckee 01/02/2014, 10:12 AM

## 2014-01-02 NOTE — Progress Notes (Signed)
Report given to Darl PikesSusan ([employer]) on Mr. Brett RoughenKirkman. Awaiting now on [facility] for transportation. Assessments remains unchanged now.

## 2014-01-02 NOTE — Progress Notes (Signed)
IV leaking upon starting AM IV Zosyn. Alternate R FA IV site attempted without success. IV team consult ordered for restart. Will pass on to day shift RN that AM Zosyn not able to be given yet. Will continue to monitor.

## 2014-01-04 LAB — CULTURE, BLOOD (ROUTINE X 2): Culture: NO GROWTH

## 2014-01-14 ENCOUNTER — Inpatient Hospital Stay (HOSPITAL_COMMUNITY)
Admission: EM | Admit: 2014-01-14 | Discharge: 2014-01-17 | DRG: 640 | Disposition: A | Discharge status: Skilled Nursing Facility | Payer: Medicare Other | Attending: Internal Medicine | Admitting: Internal Medicine

## 2014-01-14 ENCOUNTER — Emergency Department (HOSPITAL_COMMUNITY): Payer: Medicare Other

## 2014-01-14 ENCOUNTER — Inpatient Hospital Stay (HOSPITAL_COMMUNITY): Payer: Medicare Other

## 2014-01-14 ENCOUNTER — Encounter (HOSPITAL_COMMUNITY): Payer: Self-pay | Admitting: *Deleted

## 2014-01-14 DIAGNOSIS — Z66 Do not resuscitate: Secondary | ICD-10-CM | POA: Diagnosis present

## 2014-01-14 DIAGNOSIS — G319 Degenerative disease of nervous system, unspecified: Secondary | ICD-10-CM | POA: Diagnosis present

## 2014-01-14 DIAGNOSIS — J690 Pneumonitis due to inhalation of food and vomit: Secondary | ICD-10-CM | POA: Diagnosis present

## 2014-01-14 DIAGNOSIS — R1310 Dysphagia, unspecified: Secondary | ICD-10-CM | POA: Diagnosis present

## 2014-01-14 DIAGNOSIS — G8929 Other chronic pain: Secondary | ICD-10-CM | POA: Diagnosis present

## 2014-01-14 DIAGNOSIS — E860 Dehydration: Secondary | ICD-10-CM | POA: Diagnosis present

## 2014-01-14 DIAGNOSIS — R7881 Bacteremia: Secondary | ICD-10-CM | POA: Diagnosis present

## 2014-01-14 DIAGNOSIS — G9341 Metabolic encephalopathy: Secondary | ICD-10-CM | POA: Diagnosis present

## 2014-01-14 DIAGNOSIS — E870 Hyperosmolality and hypernatremia: Secondary | ICD-10-CM | POA: Diagnosis present

## 2014-01-14 DIAGNOSIS — E785 Hyperlipidemia, unspecified: Secondary | ICD-10-CM | POA: Diagnosis present

## 2014-01-14 DIAGNOSIS — G309 Alzheimer's disease, unspecified: Secondary | ICD-10-CM | POA: Diagnosis present

## 2014-01-14 DIAGNOSIS — F0280 Dementia in other diseases classified elsewhere without behavioral disturbance: Secondary | ICD-10-CM | POA: Diagnosis present

## 2014-01-14 DIAGNOSIS — N179 Acute kidney failure, unspecified: Secondary | ICD-10-CM | POA: Diagnosis present

## 2014-01-14 DIAGNOSIS — M1990 Unspecified osteoarthritis, unspecified site: Secondary | ICD-10-CM | POA: Diagnosis present

## 2014-01-14 DIAGNOSIS — R4182 Altered mental status, unspecified: Secondary | ICD-10-CM | POA: Diagnosis present

## 2014-01-14 DIAGNOSIS — N400 Enlarged prostate without lower urinary tract symptoms: Secondary | ICD-10-CM | POA: Diagnosis present

## 2014-01-14 DIAGNOSIS — N183 Chronic kidney disease, stage 3 (moderate): Secondary | ICD-10-CM | POA: Diagnosis present

## 2014-01-14 DIAGNOSIS — E1100 Type 2 diabetes mellitus with hyperosmolarity without nonketotic hyperglycemic-hyperosmolar coma (NKHHC): Secondary | ICD-10-CM | POA: Diagnosis present

## 2014-01-14 DIAGNOSIS — B9620 Unspecified Escherichia coli [E. coli] as the cause of diseases classified elsewhere: Secondary | ICD-10-CM | POA: Diagnosis present

## 2014-01-14 DIAGNOSIS — E1165 Type 2 diabetes mellitus with hyperglycemia: Secondary | ICD-10-CM | POA: Diagnosis present

## 2014-01-14 DIAGNOSIS — R739 Hyperglycemia, unspecified: Secondary | ICD-10-CM | POA: Diagnosis present

## 2014-01-14 LAB — COMPREHENSIVE METABOLIC PANEL
ALK PHOS: 76 U/L (ref 39–117)
ALT: 10 U/L (ref 0–53)
AST: 11 U/L (ref 0–37)
Albumin: 2.9 g/dL — ABNORMAL LOW (ref 3.5–5.2)
Anion gap: 14 (ref 5–15)
BUN: 63 mg/dL — ABNORMAL HIGH (ref 6–23)
CO2: 25 meq/L (ref 19–32)
Calcium: 9.6 mg/dL (ref 8.4–10.5)
Chloride: 113 mEq/L — ABNORMAL HIGH (ref 96–112)
Creatinine, Ser: 2.12 mg/dL — ABNORMAL HIGH (ref 0.50–1.35)
GFR calc non Af Amer: 27 mL/min — ABNORMAL LOW (ref >90)
GFR, EST AFRICAN AMERICAN: 31 mL/min — AB (ref >90)
Glucose, Bld: 515 mg/dL — ABNORMAL HIGH (ref 70–99)
Potassium: 5 mEq/L (ref 3.7–5.3)
SODIUM: 152 meq/L — AB (ref 137–147)
Total Bilirubin: 0.3 mg/dL (ref 0.3–1.2)
Total Protein: 7.3 g/dL (ref 6.0–8.3)

## 2014-01-14 LAB — BLOOD GAS, ARTERIAL
Acid-Base Excess: 0.8 mmol/L (ref 0.0–2.0)
Bicarbonate: 25.1 mEq/L — ABNORMAL HIGH (ref 20.0–24.0)
DRAWN BY: 103701
FIO2: 0.21 %
O2 Saturation: 95.6 %
PCO2 ART: 41.1 mmHg (ref 35.0–45.0)
PH ART: 7.403 (ref 7.350–7.450)
Patient temperature: 98.6
TCO2: 22.1 mmol/L (ref 0–100)
pO2, Arterial: 73.7 mmHg — ABNORMAL LOW (ref 80.0–100.0)

## 2014-01-14 LAB — TROPONIN I: Troponin I: <0.3 ng/mL (ref <0.30)

## 2014-01-14 LAB — ABO/RH: ABO/RH(D): O POS

## 2014-01-14 LAB — CBG MONITORING, ED
GLUCOSE-CAPILLARY: 438 mg/dL — AB (ref 70–99)
Glucose-Capillary: 523 mg/dL — ABNORMAL HIGH (ref 70–99)
Glucose-Capillary: 280 mg/dL — ABNORMAL HIGH (ref 70–99)

## 2014-01-14 LAB — CBC WITH DIFFERENTIAL/PLATELET
Basophils Absolute: 0 10*3/uL (ref 0.0–0.1)
Basophils Relative: 0 % (ref 0–1)
EOS PCT: 1 % (ref 0–5)
Eosinophils Absolute: 0.1 10*3/uL (ref 0.0–0.7)
HCT: 46.3 % (ref 39.0–52.0)
Hemoglobin: 14.5 g/dL (ref 13.0–17.0)
LYMPHS ABS: 1.3 10*3/uL (ref 0.7–4.0)
Lymphocytes Relative: 14 % (ref 12–46)
MCH: 29.6 pg (ref 26.0–34.0)
MCHC: 31.3 g/dL (ref 30.0–36.0)
MCV: 94.5 fL (ref 78.0–100.0)
MONOS PCT: 7 % (ref 3–12)
Monocytes Absolute: 0.6 10*3/uL (ref 0.1–1.0)
Neutro Abs: 7.4 10*3/uL (ref 1.7–7.7)
Neutrophils Relative %: 78 % — ABNORMAL HIGH (ref 43–77)
PLATELETS: 138 10*3/uL — AB (ref 150–400)
RBC: 4.9 MIL/uL (ref 4.22–5.81)
RDW: 14.2 % (ref 11.5–15.5)
WBC: 9.4 10*3/uL (ref 4.0–10.5)

## 2014-01-14 LAB — URINALYSIS, ROUTINE W REFLEX MICROSCOPIC
Bilirubin Urine: NEGATIVE
Hgb urine dipstick: NEGATIVE
Ketones, ur: NEGATIVE mg/dL
Leukocytes, UA: NEGATIVE
Nitrite: NEGATIVE
PH: 5 (ref 5.0–8.0)
Protein, ur: NEGATIVE mg/dL
SPECIFIC GRAVITY, URINE: 1.025 (ref 1.005–1.030)
Urobilinogen, UA: 0.2 mg/dL (ref 0.0–1.0)

## 2014-01-14 LAB — PROTIME-INR
INR: 1.19 (ref 0.00–1.49)
Prothrombin Time: 15.2 seconds (ref 11.6–15.2)

## 2014-01-14 LAB — TYPE AND SCREEN
ABO/RH(D): O POS
Antibody Screen: NEGATIVE

## 2014-01-14 LAB — MAGNESIUM: Magnesium: 2.6 mg/dL — ABNORMAL HIGH (ref 1.5–2.5)

## 2014-01-14 LAB — GLUCOSE, CAPILLARY
GLUCOSE-CAPILLARY: 177 mg/dL — AB (ref 70–99)
GLUCOSE-CAPILLARY: 194 mg/dL — AB (ref 70–99)
GLUCOSE-CAPILLARY: 229 mg/dL — AB (ref 70–99)
Glucose-Capillary: 363 mg/dL — ABNORMAL HIGH (ref 70–99)

## 2014-01-14 LAB — URINE MICROSCOPIC-ADD ON: Urine-Other: NONE SEEN

## 2014-01-14 LAB — APTT: aPTT: 33 seconds (ref 24–37)

## 2014-01-14 MED ORDER — ACETAMINOPHEN 325 MG PO TABS
650.0000 mg | ORAL_TABLET | Freq: Four times a day (QID) | ORAL | Status: DC | PRN
  Administered 2014-01-16 – 2014-01-17 (×3): 650 mg via ORAL
  Filled 2014-01-14 (×3): qty 2

## 2014-01-14 MED ORDER — DEXTROSE 50 % IV SOLN: 25.0000 mL | INTRAVENOUS | Status: DC | PRN

## 2014-01-14 MED ORDER — LEVALBUTEROL HCL 0.63 MG/3ML IN NEBU
0.6300 mg | INHALATION_SOLUTION | Freq: Four times a day (QID) | RESPIRATORY_TRACT | Status: DC | PRN
  Administered 2014-01-16: 0.63 mg via RESPIRATORY_TRACT
  Filled 2014-01-14: qty 3

## 2014-01-14 MED ORDER — INSULIN REGULAR BOLUS VIA INFUSION
0.0000 [IU] | Freq: Three times a day (TID) | INTRAVENOUS | Status: DC
  Filled 2014-01-14: qty 10

## 2014-01-14 MED ORDER — ONDANSETRON HCL 4 MG PO TABS: 4.0000 mg | ORAL_TABLET | Freq: Four times a day (QID) | ORAL | Status: DC | PRN

## 2014-01-14 MED ORDER — SODIUM CHLORIDE 0.9 % IV SOLN: INTRAVENOUS | Status: DC

## 2014-01-14 MED ORDER — DEXTROSE-NACL 5-0.45 % IV SOLN
INTRAVENOUS | Status: DC
  Administered 2014-01-14: 20:00:00 via INTRAVENOUS

## 2014-01-14 MED ORDER — DONEPEZIL HCL 10 MG PO TABS
10.0000 mg | ORAL_TABLET | Freq: Every day | ORAL | Status: DC
  Administered 2014-01-14 – 2014-01-16 (×3): 10 mg via ORAL
  Filled 2014-01-14 (×5): qty 1

## 2014-01-14 MED ORDER — HEPARIN SODIUM (PORCINE) 5000 UNIT/ML IJ SOLN
5000.0000 [IU] | Freq: Three times a day (TID) | INTRAMUSCULAR | Status: DC
  Administered 2014-01-15 – 2014-01-17 (×8): 5000 [IU] via SUBCUTANEOUS
  Filled 2014-01-14 (×9): qty 1

## 2014-01-14 MED ORDER — SODIUM CHLORIDE 0.9 % IV SOLN
INTRAVENOUS | Status: DC
  Administered 2014-01-14: 2.2 [IU]/h via INTRAVENOUS
  Administered 2014-01-14: 1 [IU]/h via INTRAVENOUS
  Filled 2014-01-14: qty 2.5

## 2014-01-14 MED ORDER — RESOURCE THICKENUP CLEAR PO POWD
ORAL | Status: DC | PRN
  Filled 2014-01-14: qty 125

## 2014-01-14 MED ORDER — SODIUM CHLORIDE 0.9 % IV SOLN
1000.0000 mL | INTRAVENOUS | Status: DC
  Administered 2014-01-14 – 2014-01-15 (×2): 1000 mL via INTRAVENOUS

## 2014-01-14 MED ORDER — SODIUM CHLORIDE 0.9 % IJ SOLN: 3.0000 mL | Freq: Two times a day (BID) | INTRAMUSCULAR | Status: DC

## 2014-01-14 MED ORDER — ONDANSETRON HCL 4 MG/2ML IJ SOLN: 4.0000 mg | Freq: Four times a day (QID) | INTRAMUSCULAR | Status: DC | PRN

## 2014-01-14 MED ORDER — ACETAMINOPHEN 650 MG RE SUPP
650.0000 mg | Freq: Four times a day (QID) | RECTAL | Status: DC | PRN
Start: 2014-01-14 — End: 2014-01-17

## 2014-01-14 NOTE — Progress Notes (Signed)
CSW met with Pt at bedside. Family was also at bedside. Per daughter Charlie Pitter- [phone]), The Pt is in the memory care unit at [facility] and plans to return there upon discharge. Per Chart, the Pt has a history of Alzheimer's and dementia.   Willette Brace [phone] ED CSW 01/14/2014 5:08 PM

## 2014-01-14 NOTE — Plan of Care (Signed)
Problem: Phase I Progression Outcomes Goal: Pain controlled with appropriate interventions Outcome: Completed/Met Date Met:  01/14/14 Goal: OOB as tolerated unless otherwise ordered Outcome: Progressing Goal: Initial discharge plan identified Outcome: Progressing Goal: Voiding-avoid urinary catheter unless indicated Outcome: Completed/Met Date Met:  01/14/14 Goal: Hemodynamically stable Outcome: Completed/Met Date Met:  01/14/14 Goal: Other Phase I Outcomes/Goals Outcome: Progressing  Problem: Phase II Progression Outcomes Goal: Progress activity as tolerated unless otherwise ordered Outcome: Progressing Goal: Discharge plan established Outcome: Progressing Goal: Vital signs remain stable Outcome: Completed/Met Date Met:  01/14/14

## 2014-01-14 NOTE — ED Notes (Signed)
Per EMS - [place] [facility] [place].  Patient has hx of DM.  Patient started 100 units Lantus and Tramadol today.  Tramadol was started for chronic knee pain.  Nursing staff called EMS because patient was uncharacteristically agitated - throwing dishes in the dining room, etc.  CBG at facility was 600, 546 with EMS.  NSR on monitor with unifocal PVC's.  Vitals on scene, 120/74, HR 94, 96% on RA.

## 2014-01-14 NOTE — ED Notes (Signed)
Bed: WA03 Expected date:  Expected time:  Means of arrival:  Comments: EMS agitation/hyperglycemia

## 2014-01-14 NOTE — ED Provider Notes (Signed)
CSN: [id number]     Arrival date & time 01/14/14  1234 History   First MD Initiated Contact with Patient 01/14/14 1258     Chief Complaint  Patient presents with  . Hyperglycemia   Level V caveat: Dementia Patient is a 80 y.o. male presenting with hyperglycemia.  Hyperglycemia  Patient was sent to the emergency room because of hyperglycemia today. The patient has a history of dementia. He was eating lunch at his nursing facility when he became combative. Patient apparently was throwing his food. As part of that evaluation the staff checked his blood sugar and found it to be extremely elevated. Patient was then sent to the emergency room for further evaluation. Patient has history of dementia and is unable to provide any additional history. He is not sure why he is here. Past Medical History  Diagnosis Date  . Dementia   . BPH (benign prostatic hypertrophy)   . Hyperlipidemia    History reviewed. No pertinent past surgical history. No family history on file. History  Substance Use Topics  . Smoking status: Never Smoker   . Smokeless tobacco: Not on file  . Alcohol Use: No    Review of Systems  Unable to perform ROS: Dementia      Allergies  Review of patient's allergies indicates no known allergies.  Home Medications   Prior to Admission medications   Medication Sig Start Date End Date Taking? Authorizing Provider  acetaminophen (TYLENOL) 500 MG tablet Take 1,000 mg by mouth 2 (two) times daily.   Yes Historical Provider, MD  donepezil (ARICEPT) 10 MG tablet Take 10 mg by mouth at bedtime.    Yes Historical Provider, MD  furosemide (LASIX) 40 MG tablet Take 40 mg by mouth daily with breakfast.    Yes Historical Provider, MD  insulin glargine (LANTUS) 100 UNIT/ML injection Inject 16 Units into the skin daily with breakfast.   Yes Historical Provider, MD  LORazepam (ATIVAN) 0.5 MG tablet Take 0.5 mg by mouth every 6 (six) hours as needed (for anxiety / agitation).    Yes  Historical Provider, MD  Melatonin 3 MG TABS Take 3 mg by mouth at bedtime.   Yes Historical Provider, MD  potassium chloride SA (K-DUR,KLOR-CON) 20 MEQ tablet Take 20 mEq by mouth daily.   Yes Historical Provider, MD  ranitidine (ZANTAC) 75 MG tablet Take 150 mg by mouth daily.    Yes Historical Provider, MD  Skin Protectants, Misc. (BAZA PROTECT EX) Apply 1 application topically as needed (Apply to affected area after each incontinent episode. Notify MD if symptoms persist over a week.).   Yes Historical Provider, MD  traMADol (ULTRAM) 50 MG tablet Take 25 mg by mouth 3 (three) times daily.   Yes Historical Provider, MD  glipiZIDE (GLUCOTROL) 5 MG tablet Take 1 tablet (5 mg total) by mouth daily before breakfast. Patient not taking: Reported on 01/14/2014 01/02/14   Catarina Hartshorn, MD  Maltodextrin-Xanthan Gum (RESOURCE [facility] CLEAR) POWD Use as needed to thicken thin liquids for swallowing Patient not taking: Reported on 01/14/2014 01/02/14   Catarina Hartshorn, MD   BP 141/66 mmHg  Pulse 99  Temp(Src) 98 F (36.7 C) (Oral)  Resp 21  SpO2 95% Physical Exam  Constitutional: He appears well-developed and well-nourished. No distress.  HENT:  Head: Normocephalic and atraumatic.  Right Ear: External ear normal.  Left Ear: External ear normal.  Eyes: Conjunctivae are normal. Right eye exhibits no discharge. Left eye exhibits no discharge. No scleral icterus.  Pupils are constricted bilaterally  Neck: Neck supple. No tracheal deviation present.  Cardiovascular: Normal rate, regular rhythm and intact distal pulses.   Pulmonary/Chest: Effort normal and breath sounds normal. No stridor. No respiratory distress. He has no wheezes. He has no rales.  Abdominal: Soft. Bowel sounds are normal. He exhibits no distension. There is no tenderness. There is no rebound and no guarding.  Musculoskeletal: He exhibits edema. He exhibits no tenderness.  Edema bilateral Ankles  Neurological: He is alert. He has normal  strength. No cranial nerve deficit (no facial droop, extraocular movements intact, no slurred speech) or sensory deficit. He exhibits normal muscle tone. He displays no seizure activity. Coordination normal. GCS eye subscore is 4. GCS verbal subscore is 4. GCS motor subscore is 6.  Patient is able to move all 4 extremities, no drift bilateral upper and lower lower extremities, no facial droop,   Skin: Skin is warm and dry. No rash noted. He is not diaphoretic.  Psychiatric: He has a normal mood and affect.  Nursing note and vitals reviewed.   ED Course  Procedures (including critical care time) Labs Review Labs Reviewed  CBC WITH DIFFERENTIAL - Abnormal; Notable for the following:    Platelets 138 (*)    Neutrophils Relative % 78 (*)    All other components within normal limits  COMPREHENSIVE METABOLIC PANEL - Abnormal; Notable for the following:    Sodium 152 (*)    Chloride 113 (*)    Glucose, Bld 515 (*)    BUN 63 (*)    Creatinine, Ser 2.12 (*)    Albumin 2.9 (*)    GFR calc non Af Amer 27 (*)    GFR calc Af Amer 31 (*)    All other components within normal limits  BLOOD GAS, ARTERIAL - Abnormal; Notable for the following:    pO2, Arterial 73.7 (*)    Bicarbonate 25.1 (*)    All other components within normal limits  CBG MONITORING, ED - Abnormal; Notable for the following:    Glucose-Capillary 523 (*)    All other components within normal limits  CBG MONITORING, ED - Abnormal; Notable for the following:    Glucose-Capillary 438 (*)    All other components within normal limits  APTT  PROTIME-INR  URINALYSIS, ROUTINE W REFLEX MICROSCOPIC  TYPE AND SCREEN  ABO/RH    Imaging Review Dg Chest 1 View  01/14/2014   CLINICAL DATA:  Hyperglycemia, decreased responsiveness  EXAM: CHEST - 1 VIEW  COMPARISON:  12/31/2013  FINDINGS: Cardiac shadow is stable. Patchy bronchitic changes are noted bilaterally more marked in the bases which have increased from the prior exam. No focal  confluent infiltrate is seen.  IMPRESSION: Increased bronchitic changes which are increased in the basis when compared with the prior exam.   Electronically Signed   By: Alcide CleverMark  Lukens M.D.   On: 01/14/2014 14:34   Ct Head Wo Contrast  01/14/2014   CLINICAL DATA:  Altered mental status.  Dementia.  Hyperglycemia.  EXAM: CT HEAD WITHOUT CONTRAST  TECHNIQUE: Contiguous axial images were obtained from the base of the skull through the vertex without intravenous contrast.  COMPARISON:  07/15/2013  FINDINGS: There is no evidence of intracranial hemorrhage, brain edema, or other signs of acute infarction. There is no evidence of intracranial mass lesion or mass effect. No abnormal extraaxial fluid collections are identified.  Moderate diffuse cerebral atrophy is stable. Ventricles are stable in size. No skull abnormality identified.  IMPRESSION: No acute  intracranial abnormality.  Stable cerebral atrophy.   Electronically Signed   By: Myles RosenthalJohn  Stahl M.D.   On: 01/14/2014 14:13     EKG Interpretation   Date/Time:  Wednesday January 14 2014 13:30:58 EST Ventricular Rate:  86 PR Interval:  164 QRS Duration: 85 QT Interval:  352 QTC Calculation: 421 R Axis:   73 Text Interpretation:  Sinus rhythm Low voltage, precordial leads  Borderline T abnormalities, diffuse leads No significant change since last  tracing Confirmed by Tito Ausmus  MD-J, Zael Shuman ([id number]) on 01/14/2014 1:35:57 PM     Medications  0.9 %  sodium chloride infusion (not administered)  insulin regular (NOVOLIN R,HUMULIN R) 250 Units in sodium chloride 0.9 % 250 mL (1 Units/mL) infusion (not administered)    MDM   Final diagnoses:  Hyperglycemia  Hypernatremia  Acute renal failure, unspecified acute renal failure type    Lab tests show hypernatremia associated with acute renal failure and hyperglycemia. May be prerenal related to decreased po intake.  No history of vomiting or diarrhea.  Will continue IV fluids and IV insulin.   Plan on  hospitalization for further treatment.  Linwood DibblesJon Cleaven Demario, MD 01/14/14 [phone]

## 2014-01-14 NOTE — ED Notes (Signed)
Pt confused and aggitated at nursing home.  Was throwing things in cafeteria per EMS.  Pin point pupils. Pt sts pain all over.  Denies nausea.  Is verbal but very groggy and will awake to voice or touch.

## 2014-01-14 NOTE — H&P (Addendum)
[facility] History and Physical  Yossi Hinchman ZOX:[id number] DOB: 11-11-1926 DOA: 01/14/2014  Referring physician:   PCP: Darnelle Bos, MD   Chief Complaint: Altered mental status  HPI:  80 year old male recently admitted 11/9-11/13 for Escherichia coli urosepsis treated with Zosyn and levofloxacin, dehydration with acute renal failure, presents today from [facility] [place] with altered mental status, agitation, complaining of pain all over. Patient CBG was 600 at the facility. Per daughter was present by the bedside the patient's glipizide was discontinued on Monday and the patient was switched to Lantus, started on tramadol for chronic knee pain. No subjective fevers or shortness of breath. Patient confused and unable to give any history. Repeat CBG in the ER was 5:15. Vital signs at the nursing home her systolic blood pressure 1 20 .70, 96% on room air. In the ER the patient's workup including CT scan of the head as well as chest x-ray was negative. BMP showed patient's sodium was 152 and creatinine was 2.12, baseline 1-1.5. WBC count was normal at 9.4     Review of Systems: negative for the following  Unable to perform ROS: Dementia , pertinent positives obtained from history of present illness    Past Medical History  Diagnosis Date  . Dementia   . BPH (benign prostatic hypertrophy)   . Hyperlipidemia      History reviewed. No pertinent past surgical history.    Social History:  reports that he has never smoked. He does not have any smokeless tobacco history on file. He reports that he does not drink alcohol or use illicit drugs.    No Known Allergies  No family history on file.   Prior to Admission medications   Medication Sig Start Date End Date Taking? Authorizing Provider  acetaminophen (TYLENOL) 500 MG tablet Take 1,000 mg by mouth 2 (two) times daily.   Yes Historical Provider, MD  donepezil (ARICEPT) 10 MG tablet Take 10 mg by  mouth at bedtime.    Yes Historical Provider, MD  furosemide (LASIX) 40 MG tablet Take 40 mg by mouth daily with breakfast.    Yes Historical Provider, MD  insulin glargine (LANTUS) 100 UNIT/ML injection Inject 16 Units into the skin daily with breakfast.   Yes Historical Provider, MD  LORazepam (ATIVAN) 0.5 MG tablet Take 0.5 mg by mouth every 6 (six) hours as needed (for anxiety / agitation).    Yes Historical Provider, MD  Melatonin 3 MG TABS Take 3 mg by mouth at bedtime.   Yes Historical Provider, MD  potassium chloride SA (K-DUR,KLOR-CON) 20 MEQ tablet Take 20 mEq by mouth daily.   Yes Historical Provider, MD  ranitidine (ZANTAC) 75 MG tablet Take 150 mg by mouth daily.    Yes Historical Provider, MD  Skin Protectants, Misc. (BAZA PROTECT EX) Apply 1 application topically as needed (Apply to affected area after each incontinent episode. Notify MD if symptoms persist over a week.).   Yes Historical Provider, MD  traMADol (ULTRAM) 50 MG tablet Take 25 mg by mouth 3 (three) times daily.   Yes Historical Provider, MD  glipiZIDE (GLUCOTROL) 5 MG tablet Take 1 tablet (5 mg total) by mouth daily before breakfast. Patient not taking: Reported on 01/14/2014 01/02/14   Catarina Hartshorn, MD  Maltodextrin-Xanthan Gum (RESOURCE [facility] CLEAR) POWD Use as needed to thicken thin liquids for swallowing Patient not taking: Reported on 01/14/2014 01/02/14   Catarina Hartshorn, MD     Physical Exam: Filed Vitals:   01/14/14 1235 01/14/14  1459 01/14/14 1726  BP: 138/67 141/66 134/65  Pulse: 93 99 85  Temp: 98 F (36.7 C)    TempSrc: Oral    Resp: 16 21 20   SpO2: 97% 95% 95%     Constitutional: Vital signs reviewed. Patient is a well-developed and well-nourished in no acute distress and cooperative with exam. Confused  Head: Normocephalic and atraumatic  Ear: TM normal bilaterally  Mouth: no erythema or exudates, MMM  Eyes: PERRL, EOMI, conjunctivae normal, No scleral icterus.  Neck: Supple, Trachea midline  normal ROM, No JVD, mass, thyromegaly, or carotid bruit present.  Cardiovascular: RRR, S1 normal, S2 normal, no MRG, pulses symmetric and intact bilaterally  Pulmonary/Chest: CTAB, no wheezes, rales, or rhonchi  Abdominal: Soft. Non-tender, non-distended, bowel sounds are normal, no masses, organomegaly, or guarding present.  GU: no CVA tenderness Musculoskeletal: No joint deformities, erythema, or stiffness, ROM full and no nontender Ext: no edema and no cyanosis, pulses palpable bilaterally (DP and PT)  Hematology: no cervical, inginal, or axillary adenopathy.  Neurological: He is alert. He has normal strength. No cranial nerve deficit (no facial droop, extraocular movements intact, no slurred speech) or sensory deficit. He exhibits normal muscle tone. He displays no seizure activity. Coordination normal. GCS eye subscore is 4. GCS verbal subscore is 4. GCS motor subscore is 6.  Patient is able to move all 4 extremities, no drift bilateral upper and lower lower extremities, no facial droop,  Skin: Warm, dry and intact. No rash, cyanosis, or clubbing.  Psychiatric: Normal mood and affect. speech and behavior is normal. Judgment and thought content normal. Cognition and memory are normal.       Labs on Admission:    Basic Metabolic Panel:  Recent Labs Lab 01/14/14 1420  NA 152*  K 5.0  CL 113*  CO2 25  GLUCOSE 515*  BUN 63*  CREATININE 2.12*  CALCIUM 9.6   Liver Function Tests:  Recent Labs Lab 01/14/14 1420  AST 11  ALT 10  ALKPHOS 76  BILITOT 0.3  PROT 7.3  ALBUMIN 2.9*   No results for input(s): LIPASE, AMYLASE in the last 168 hours. No results for input(s): AMMONIA in the last 168 hours. CBC:  Recent Labs Lab 01/14/14 1420  WBC 9.4  NEUTROABS 7.4  HGB 14.5  HCT 46.3  MCV 94.5  PLT 138*   Cardiac Enzymes: No results for input(s): CKTOTAL, CKMB, CKMBINDEX, TROPONINI in the last 168 hours.  BNP (last 3 results)  Recent Labs  01/01/14 0328  PROBNP  1228.0*      CBG:  Recent Labs Lab 01/14/14 1238 01/14/14 1452 01/14/14 1655  GLUCAP 523* 438* 280*    Radiological Exams on Admission: Dg Chest 1 View  01/14/2014   CLINICAL DATA:  Hyperglycemia, decreased responsiveness  EXAM: CHEST - 1 VIEW  COMPARISON:  12/31/2013  FINDINGS: Cardiac shadow is stable. Patchy bronchitic changes are noted bilaterally more marked in the bases which have increased from the prior exam. No focal confluent infiltrate is seen.  IMPRESSION: Increased bronchitic changes which are increased in the basis when compared with the prior exam.   Electronically Signed   By: Alcide CleverMark  Lukens M.D.   On: 01/14/2014 14:34   Ct Head Wo Contrast  01/14/2014   CLINICAL DATA:  Altered mental status.  Dementia.  Hyperglycemia.  EXAM: CT HEAD WITHOUT CONTRAST  TECHNIQUE: Contiguous axial images were obtained from the base of the skull through the vertex without intravenous contrast.  COMPARISON:  07/15/2013  FINDINGS: There  is no evidence of intracranial hemorrhage, brain edema, or other signs of acute infarction. There is no evidence of intracranial mass lesion or mass effect. No abnormal extraaxial fluid collections are identified.  Moderate diffuse cerebral atrophy is stable. Ventricles are stable in size. No skull abnormality identified.  IMPRESSION: No acute intracranial abnormality.  Stable cerebral atrophy.   Electronically Signed   By: Myles RosenthalJohn  Stahl M.D.   On: 01/14/2014 14:13    EKG: Independently reviewed.    Assessment/Plan Active Problems:   Hyperglycemia   Type 2 diabetes mellitus with hyperosmolar nonketotic hyperglycemia  Acute metabolic encephalopathy This is likely secondary to hypernatremia, acute on chronic renal failure and hyperglycemia Patient will be admitted for the above reasons  Hyperosmolar nonketotic hyperglycemia Patient will be started on an insulin drip Recent hemoglobin A1c was 9.2 Patient's glipizide was switched to Lantus Awaiting updated  medical reconciliation  Recent Escherichia coli bacteremia Acute on chronic renal failure Start patient on aggressive IV hydration, does not appear septic Rule out obstructive uropathy Obtain a renal ultrasound   Acute on chronic CKD stage III -Baseline creatinine 1.1-1.5 -monitor serum creatinine, hold furosemide -  Dysphagia/aspiration pneumonia -speech therapist recommends Dys 2 diet with nectar thickened, during last admission -CXR--no infiltrates, chronic bronchitic changes -presently stable on room air    Code Status:   full Family Communication: bedside Disposition Plan: admit   Time spent: 70 mins   [facility] HospitalBROL,[place] [facility] Pager [phone]  If 7PM-7AM, please contact night-coverage [email or web address] Password [email or web address] 01/14/2014, 5:57 PM

## 2014-01-14 NOTE — ED Notes (Signed)
Ultrasound at bedside.  RN Waiting to take pt up.

## 2014-01-15 LAB — COMPREHENSIVE METABOLIC PANEL
ALBUMIN: 2.5 g/dL — AB (ref 3.5–5.2)
ALK PHOS: 56 U/L (ref 39–117)
ALT: 8 U/L (ref 0–53)
ANION GAP: 9 (ref 5–15)
AST: 16 U/L (ref 0–37)
BUN: 56 mg/dL — ABNORMAL HIGH (ref 6–23)
CO2: 26 mEq/L (ref 19–32)
Calcium: 9.3 mg/dL (ref 8.4–10.5)
Chloride: 119 mEq/L — ABNORMAL HIGH (ref 96–112)
Creatinine, Ser: 1.86 mg/dL — ABNORMAL HIGH (ref 0.50–1.35)
GFR calc Af Amer: 36 mL/min — ABNORMAL LOW (ref >90)
GFR calc non Af Amer: 31 mL/min — ABNORMAL LOW (ref >90)
Glucose, Bld: 129 mg/dL — ABNORMAL HIGH (ref 70–99)
POTASSIUM: 4.7 meq/L (ref 3.7–5.3)
SODIUM: 154 meq/L — AB (ref 137–147)
TOTAL PROTEIN: 6.4 g/dL (ref 6.0–8.3)
Total Bilirubin: 0.3 mg/dL (ref 0.3–1.2)

## 2014-01-15 LAB — CBC
HEMATOCRIT: 42.4 % (ref 39.0–52.0)
HEMOGLOBIN: 12.9 g/dL — AB (ref 13.0–17.0)
MCH: 28.9 pg (ref 26.0–34.0)
MCHC: 30.4 g/dL (ref 30.0–36.0)
MCV: 94.9 fL (ref 78.0–100.0)
Platelets: 138 10*3/uL — ABNORMAL LOW (ref 150–400)
RBC: 4.47 MIL/uL (ref 4.22–5.81)
RDW: 14.3 % (ref 11.5–15.5)
WBC: 9.4 10*3/uL (ref 4.0–10.5)

## 2014-01-15 LAB — GLUCOSE, CAPILLARY
GLUCOSE-CAPILLARY: 149 mg/dL — AB (ref 70–99)
GLUCOSE-CAPILLARY: 158 mg/dL — AB (ref 70–99)
GLUCOSE-CAPILLARY: 165 mg/dL — AB (ref 70–99)
GLUCOSE-CAPILLARY: 221 mg/dL — AB (ref 70–99)
Glucose-Capillary: 115 mg/dL — ABNORMAL HIGH (ref 70–99)
Glucose-Capillary: 120 mg/dL — ABNORMAL HIGH (ref 70–99)
Glucose-Capillary: 144 mg/dL — ABNORMAL HIGH (ref 70–99)
Glucose-Capillary: 151 mg/dL — ABNORMAL HIGH (ref 70–99)
Glucose-Capillary: 165 mg/dL — ABNORMAL HIGH (ref 70–99)
Glucose-Capillary: 296 mg/dL — ABNORMAL HIGH (ref 70–99)
Glucose-Capillary: 203 mg/dL — ABNORMAL HIGH (ref 70–99)

## 2014-01-15 LAB — TROPONIN I

## 2014-01-15 MED ORDER — INSULIN GLARGINE 100 UNIT/ML ~~LOC~~ SOLN
20.0000 [IU] | SUBCUTANEOUS | Status: DC
  Administered 2014-01-15 – 2014-01-17 (×3): 20 [IU] via SUBCUTANEOUS
  Filled 2014-01-15 (×4): qty 0.2

## 2014-01-15 MED ORDER — SODIUM CHLORIDE 0.45 % IV SOLN
INTRAVENOUS | Status: DC
  Administered 2014-01-15 – 2014-01-17 (×4): via INTRAVENOUS

## 2014-01-15 MED ORDER — INSULIN GLARGINE 100 UNIT/ML ~~LOC~~ SOLN
20.0000 [IU] | Freq: Every day | SUBCUTANEOUS | Status: DC
  Filled 2014-01-15: qty 0.2

## 2014-01-15 MED ORDER — INSULIN ASPART 100 UNIT/ML ~~LOC~~ SOLN
0.0000 [IU] | Freq: Three times a day (TID) | SUBCUTANEOUS | Status: DC
  Administered 2014-01-15: 7 [IU] via SUBCUTANEOUS
  Administered 2014-01-15: 5 [IU] via SUBCUTANEOUS
  Administered 2014-01-15: 11 [IU] via SUBCUTANEOUS
  Administered 2014-01-16 (×2): 3 [IU] via SUBCUTANEOUS
  Administered 2014-01-17: 7 [IU] via SUBCUTANEOUS
  Administered 2014-01-17: 3 [IU] via SUBCUTANEOUS

## 2014-01-15 NOTE — Progress Notes (Signed)
[facility] Progress note  Brett Mckee WJX:[id number] DOB: 11-Jun-1926 DOA: 01/14/2014  Referring physician:   PCP: Darnelle Bos, MD   Chief Complaint: Altered mental status   Assessment and plan  Acute metabolic encephalopathy This is likely secondary to hypernatremia, acute on chronic renal failure and hyperglycemia HypERnatremia is worse today  Hyperosmolar nonketotic hyperglycemia Tapered off on an insulin drip, transitioned to Lantus and sliding scale insulin Recent hemoglobin A1c was 9.2 Glipizide discontinued last week admission   Recent Escherichia coli bacteremia Acute on chronic renal failure Continue on aggressive IV hydration, does not appear septic Renal ultrasound completed No evidence of hydronephrosis   Acute on chronic CKD stage III -Baseline creatinine 1.1-1.5-improving since admission -monitor serum creatinine, hold furosemide -  Dysphagia/aspiration pneumonia -speech therapist recommends Dys 2 diet with nectar thickened, during last admission -CXR--no infiltrates, chronic bronchitic changes -presently stable on room air  DO NOT RESUSCITATE   HPI:  80 year old male recently admitted 11/9-11/13 for Escherichia coli urosepsis treated with Zosyn and levofloxacin, dehydration with acute renal failure, presents today from [facility] [place] with altered mental status, agitation, complaining of pain all over. Patient CBG was 600 at the facility. Per daughter was present by the bedside the patient's glipizide was discontinued on Monday and the patient was switched to Lantus, started on tramadol for chronic knee pain. No subjective fevers or shortness of breath. Patient confused and unable to give any history. Repeat CBG in the ER was 5:15. Vital signs at the nursing home her systolic blood pressure 1 20 .70, 96% on room air. In the ER the patient's workup including CT scan of the head as well as chest x-ray was negative. BMP  showed patient's sodium was 152 and creatinine was 2.12, baseline 1-1.5. WBC count was normal at 9.4     Review of Systems: negative for the following  Unable to perform ROS: Dementia , pertinent positives obtained from history of present illness    Past Medical History  Diagnosis Date  . Dementia   . BPH (benign prostatic hypertrophy)   . Hyperlipidemia      History reviewed. No pertinent past surgical history.    Social History:  reports that he has never smoked. He does not have any smokeless tobacco history on file. He reports that he does not drink alcohol or use illicit drugs.    No Known Allergies  No family history on file.                            Take 40 mg by mouth daily with breakfast.    Yes Historical Provider, MD                                                                     Physical Exam: Filed Vitals:   01/14/14 1726 01/14/14 1829 01/14/14 1900 01/15/14 0549  BP: 134/65 102/78  130/70  Pulse: 85 84  94  Temp:  97.2 F (36.2 C)  97.5 F (36.4 C)  TempSrc:    Oral  Resp: 20 16  18   Height:   5\' 10"  (1.778 m)   Weight:   96.525 kg (212 lb 12.8 oz)   SpO2: 95% 98%  95%     Constitutional: Vital signs reviewed. Patient is a well-developed and well-nourished in no acute distress and cooperative with exam. Confused  Head: Normocephalic and atraumatic  Ear: TM normal bilaterally  Mouth: no erythema or exudates, MMM  Eyes: PERRL, EOMI, conjunctivae normal, No scleral icterus.  Neck: Supple, Trachea midline normal ROM, No JVD, mass, thyromegaly, or carotid bruit present.  Cardiovascular: RRR, S1 normal, S2 normal, no MRG, pulses symmetric and intact bilaterally  Pulmonary/Chest: CTAB, no wheezes, rales, or rhonchi  Abdominal: Soft. Non-tender, non-distended, bowel sounds are normal, no masses, organomegaly, or guarding present.  GU: no CVA tenderness Musculoskeletal: No joint deformities, erythema, or stiffness, ROM full and no  nontender Ext: no edema and no cyanosis, pulses palpable bilaterally (DP and PT)  Hematology: no cervical, inginal, or axillary adenopathy.  Neurological: He is alert. He has normal strength. No cranial nerve deficit (no facial droop, extraocular movements intact, no slurred speech) or sensory deficit. He exhibits normal muscle tone. He displays no seizure activity. Coordination normal. GCS eye subscore is 4. GCS verbal subscore is 4. GCS motor subscore is 6.  Patient is able to move all 4 extremities, no drift bilateral upper and lower lower extremities, no facial droop,  Skin: Warm, dry and intact. No rash, cyanosis, or clubbing.  Psychiatric: Normal mood and affect. speech and behavior is normal. Judgment and thought content normal. Cognition and memory are normal.       Labs on Admission:    Basic Metabolic Panel:  Recent Labs Lab 01/14/14 1420 01/14/14 1900 01/15/14 0125  NA 152*  --  154*  K 5.0  --  4.7  CL 113*  --  119*  CO2 25  --  26  GLUCOSE 515*  --  129*  BUN 63*  --  56*  CREATININE 2.12*  --  1.86*  CALCIUM 9.6  --  9.3  MG  --  2.6*  --    Liver Function Tests:  Recent Labs Lab 01/14/14 1420 01/15/14 0125  AST 11 16  ALT 10 8  ALKPHOS 76 56  BILITOT 0.3 0.3  PROT 7.3 6.4  ALBUMIN 2.9* 2.5*   No results for input(s): LIPASE, AMYLASE in the last 168 hours. No results for input(s): AMMONIA in the last 168 hours. CBC:  Recent Labs Lab 01/14/14 1420 01/15/14 0125  WBC 9.4 9.4  NEUTROABS 7.4  --   HGB 14.5 12.9*  HCT 46.3 42.4  MCV 94.5 94.9  PLT 138* 138*   Cardiac Enzymes:  Recent Labs Lab 01/14/14 1900 01/15/14 0125  TROPONINI <0.30 <0.30    BNP (last 3 results)  Recent Labs  01/01/14 0328  PROBNP 1228.0*      CBG:  Recent Labs Lab 01/15/14 0230 01/15/14 0336 01/15/14 0441 01/15/14 0525 01/15/14 0735  GLUCAP 149* 144* 151* 165* 165*    Radiological Exams on Admission: Dg Chest 1 View  01/14/2014   CLINICAL  DATA:  Hyperglycemia, decreased responsiveness  EXAM: CHEST - 1 VIEW  COMPARISON:  12/31/2013  FINDINGS: Cardiac shadow is stable. Patchy bronchitic changes are noted bilaterally more marked in the bases which have increased from the prior exam. No focal confluent infiltrate is seen.  IMPRESSION: Increased bronchitic changes which are increased in the basis when compared with the prior exam.   Electronically Signed   By: Alcide CleverMark  Lukens M.D.   On: 01/14/2014 14:34   Ct Head Wo Contrast  01/14/2014   CLINICAL DATA:  Altered mental status.  Dementia.  Hyperglycemia.  EXAM: CT HEAD WITHOUT CONTRAST  TECHNIQUE: Contiguous axial images were obtained from the base of the skull through the vertex without intravenous contrast.  COMPARISON:  07/15/2013  FINDINGS: There is no evidence of intracranial hemorrhage, brain edema, or other signs of acute infarction. There is no evidence of intracranial mass lesion or mass effect. No abnormal extraaxial fluid collections are identified.  Moderate diffuse cerebral atrophy is stable. Ventricles are stable in size. No skull abnormality identified.  IMPRESSION: No acute intracranial abnormality.  Stable cerebral atrophy.   Electronically Signed   By: Myles RosenthalJohn  Stahl M.D.   On: 01/14/2014 14:13   [place] Renal  01/14/2014   CLINICAL DATA:  Acute renal failure.  EXAM: RENAL/URINARY TRACT ULTRASOUND COMPLETE  COMPARISON:  None.  FINDINGS: Right Kidney:  Length: 9.0 cm, limited visualization. Echogenicity within normal limits. No mass or hydronephrosis visualized.  Left Kidney:  Length: 11.7 cm. 6.8 cm simple appearing cyst noted laterally off the midpole. Echogenicity within normal limits. No mass or hydronephrosis visualized.  Bladder:  Appears normal for degree of bladder distention.  IMPRESSION: No evidence of hydronephrosis.  No acute findings.   Electronically Signed   By: Charlett NoseKevin  Dover M.D.   On: 01/14/2014 18:06    EKG: Independently reviewed.    Assessment/Plan Active Problems:    Hyperglycemia   Type 2 diabetes mellitus with hyperosmolar nonketotic hyperglycemia     Code Status:   full Family Communication: bedside Disposition Plan: admit   Time spent: 70 mins   [facility]entzvilleBROL,Breeanne Oblinger [facility] Pager [phone]  If 7PM-7AM, please contact night-coverage [email or web address] Password [email or web address] 01/15/2014, 10:25 AM

## 2014-01-16 LAB — COMPREHENSIVE METABOLIC PANEL
ALK PHOS: 46 U/L (ref 39–117)
ALT: 6 U/L (ref 0–53)
ANION GAP: 8 (ref 5–15)
AST: 11 U/L (ref 0–37)
Albumin: 2.4 g/dL — ABNORMAL LOW (ref 3.5–5.2)
BUN: 34 mg/dL — AB (ref 6–23)
CO2: 26 meq/L (ref 19–32)
Calcium: 9 mg/dL (ref 8.4–10.5)
Chloride: 117 mEq/L — ABNORMAL HIGH (ref 96–112)
Creatinine, Ser: 1.55 mg/dL — ABNORMAL HIGH (ref 0.50–1.35)
GFR calc non Af Amer: 39 mL/min — ABNORMAL LOW (ref >90)
GFR, EST AFRICAN AMERICAN: 45 mL/min — AB (ref >90)
GLUCOSE: 171 mg/dL — AB (ref 70–99)
POTASSIUM: 4.1 meq/L (ref 3.7–5.3)
Sodium: 151 mEq/L — ABNORMAL HIGH (ref 137–147)
TOTAL PROTEIN: 6.2 g/dL (ref 6.0–8.3)
Total Bilirubin: 0.4 mg/dL (ref 0.3–1.2)

## 2014-01-16 LAB — GLUCOSE, CAPILLARY
GLUCOSE-CAPILLARY: 176 mg/dL — AB (ref 70–99)
Glucose-Capillary: 138 mg/dL — ABNORMAL HIGH (ref 70–99)
Glucose-Capillary: 146 mg/dL — ABNORMAL HIGH (ref 70–99)
Glucose-Capillary: 143 mg/dL — ABNORMAL HIGH (ref 70–99)
Glucose-Capillary: 248 mg/dL — ABNORMAL HIGH (ref 70–99)

## 2014-01-16 NOTE — Clinical Social Work Psychosocial (Signed)
Clinical Social Work Department BRIEF PSYCHOSOCIAL ASSESSMENT 01/16/2014  Patient:  Brett Mckee,Brett Mckee     Account Number:  [id number]     Admit date:  01/14/2014  Clinical Social Worker:  Lavell LusterAMPBELL,Gates Jividen BRYANT, LCSWA  Date/Time:  01/16/2014 04:04 PM  Referred by:  Physician  Date Referred:  01/16/2014 Referred for  SNF Placement   Other Referral:   NA   Interview type:  Family Other interview type:   Patient is very confused at this time, per daughter this is the patient's baseline.    PSYCHOSOCIAL DATA Living Status:  FACILITY Admitted from facility:  [facility] Level of care:  Assisted Living Primary support name:  Dewayne Hatchnn Primary support relationship to patient:  CHILD, ADULT Degree of support available:   Support is strong.    CURRENT CONCERNS Current Concerns  Post-Acute Placement   Other Concerns:   NA    SOCIAL WORK ASSESSMENT / PLAN CSW went to patient's room to complete assessment. CSW recongnizes patient from a previous admission. Patient is notably confused and unable to contribute to assessment at this time, no family present at bedside. CSW contacted Stark JockAnn Brady, patient's daughter, to complete assessment. Per Dewayne HatchAnn, patient has managed well at [facility]. CSW notes that SNF was recommended at discharge from last hospitalization, but family and facility felt it would benefit patient to return to the familiar environment of memory care at [facility]. MD agreed that this would be appropriate. Per daughter facility has been able to manage the patient and his needs, and the patient made great improvements with his mobility (per daughter he was walking with his walker prior to hospitalization). CSW explained that SNF has been recommended for patient again, but family would like patient to return to [facility] at discharge if they can manage his needs. Daughter reports patient will be on insulin at discharge and asks that CSW determine if the facility can handle  this. CSW will assist as appropriate.   Assessment/plan status:  Psychosocial Support/Ongoing Assessment of Needs Other assessment/ plan:   Complete Fl2, Fax, PASRR   Information/referral to community resources:   CSW contact information given.    PATIENT'S/FAMILY'S RESPONSE TO PLAN OF CARE: Patient's daughter Dewayne Hatchnn plans for the patient to return to [facility] unless facility states they cannot manage patient's needs. CSW will assist.       Roddie McBryant Yasmyn Bellisario MSW, AltaLCSWA, [place], [phone]

## 2014-01-16 NOTE — Evaluation (Signed)
Physical Therapy Evaluation Patient Details Name: Brett FastRobert Delbert Hammontree MRN: [id number] DOB: 09/02/1926 Today's Date: 01/16/2014   History of Present Illness  80 year old male recently admitted 11/9-11/13 for Escherichia coli urosepsis admitted 01/14/14 from memory care unit of [facility]  [place] with altered mental status and hyperosmolar nonketotic hyperglycemia.  Clinical Impression  Pt currently with functional limitations due to the deficits listed below (see PT Problem List).  Pt will benefit from skilled PT to increase their independence and safety with mobility to allow discharge to the venue listed below.  Pt currently requiring increased assist for bed mobility and standing however cause uncertain (cognition, weakness, and/or both.)  Pt assisting at times and then resisting movement at times.  Pt not responding to verbal cues well, so used multimodal cues to assist with mobility.  Daughter present and assist with pt as well.  Uncertain if ALF can provide required assist at this time, if not, then recommend SNF.     Follow Up Recommendations SNF if ALF not able to provide current assist level    Equipment Recommendations  None recommended by PT    Recommendations for Other Services       Precautions / Restrictions Precautions Precautions: Fall      Mobility  Bed Mobility Overal bed mobility: Needs Assistance;+2 for physical assistance Bed Mobility: Supine to Sit;Sit to Supine     Supine to sit: +2 for physical assistance;Max assist Sit to supine: Mod assist   General bed mobility comments: increased assist possibly due to decreased cognition, pt not following commands or mulitmodal cues so assisted to sitting  Transfers Overall transfer level: Needs assistance Equipment used: Rolling walker (2 wheeled) Transfers: Sit to/from Stand Sit to Stand: Max assist;+2 physical assistance;From elevated surface         General transfer comment: increased time and cues  to have pt attempt to assist, pt finally initially standing however required increased assist, very unsteady with posterior lean upon standing which pt was unable to correct so returned to sitting for safety, pt also resistent to sitting requiring hip flexion tactile cue  Ambulation/Gait                Stairs            Wheelchair Mobility    Modified Rankin (Stroke Patients Only)       Balance                                             Pertinent Vitals/Pain Pain Assessment: No/denies pain    Home Living Family/patient expects to be discharged to:: Assisted living                      Prior Function Level of Independence: Needs assistance         Comments: assist OOB due to cognition, ambulates with RW to dining hall, assist for ADLs per daughter     Hand Dominance        Extremity/Trunk Assessment               Lower Extremity Assessment: RLE deficits/detail;LLE deficits/detail;Difficult to assess due to impaired cognition RLE Deficits / Details: at least 3/5 based on functional mobility and resistance to movement LLE Deficits / Details: at least 3/5 based on functional mobility and resistance to movement     Communication   Communication:  No difficulties  Cognition Arousal/Alertness: Awake/alert Behavior During Therapy: WFL for tasks assessed/performed Overall Cognitive Status: History of cognitive impairments - at baseline                      General Comments      Exercises        Assessment/Plan    PT Assessment Patient needs continued PT services  PT Diagnosis Generalized weakness;Altered mental status   PT Problem List Decreased strength;Decreased activity tolerance;Decreased balance;Decreased mobility;Decreased knowledge of use of DME;Decreased cognition;Pain  PT Treatment Interventions DME instruction;Gait training;Functional mobility training;Therapeutic activities;Balance  training;Patient/family education;Therapeutic exercise   PT Goals (Current goals can be found in the Care Plan section) Acute Rehab PT Goals PT Goal Formulation: Patient unable to participate in goal setting Time For Goal Achievement: 01/30/14 Potential to Achieve Goals: Fair    Frequency Min 2X/week   Barriers to discharge        Co-evaluation               End of Session   Activity Tolerance: Patient tolerated treatment well Patient left: with call bell/phone within reach;in bed;with bed alarm set           Time: 1027-1100 PT Time Calculation (min) (ACUTE ONLY): 33 min   Charges:     PT Treatments $Therapeutic Activity: 23-37 mins   PT G Codes:          Tony Granquist,KATHrine E 01/16/2014, 12:42 PM Zenovia JarredKati Kaseem Vastine, PT, DPT 01/16/2014 Pager: [phone]

## 2014-01-16 NOTE — Progress Notes (Addendum)
[facility] Progress note  Windy FastRobert Delbert Mccabe WUJ:[id number] DOB: 08/14/1926 DOA: 01/14/2014  Referring physician:   PCP: Darnelle BosSBORNE,JAMES CHARLES, MD   Chief Complaint: Altered mental status   Assessment and plan  Acute metabolic encephalopathy This is likely secondary to hypernatremia, acute on chronic renal failure and hyperglycemia HypERnatremia is improving  Hyperosmolar nonketotic hyperglycemia Tapered off on an insulin drip, transitioned to Lantus and sliding scale insulin Recent hemoglobin A1c was 9.2 Glipizide discontinued last week admission Accu-Chek stable   Recent Escherichia coli bacteremia Acute on chronic renal failure Decrease half normal saline to 75 mL an hour,   Renal ultrasound completed No evidence of hydronephrosis   Acute on chronic CKD stage III -Baseline creatinine 1.1-1.5-improving since admission Diuretic on hold -  Dysphagia/aspiration pneumonia -speech therapist recommends Dys 2 diet with nectar thickened, during last admission -CXR--no infiltrates, chronic bronchitic changes -presently stable on room air  DO NOT RESUSCITATE Disposition, PT/OT eval pending   HPI:  80 year old male recently admitted 11/9-11/13 for Escherichia coli urosepsis treated with Zosyn and levofloxacin, dehydration with acute renal failure, [place] [facility] [place] with altered mental status, agitation, complaining of pain all over. Patient CBG was 600 at the facility. Per daughter was present by the bedside the patient's glipizide was discontinued on Monday and the patient was switched to Lantus, started on tramadol for chronic knee pain. No subjective fevers or shortness of breath. Patient confused and unable to give any history. Repeat CBG in the ER was 5:15. Vital signs at the nursing home her systolic blood pressure 1 20 .70, 96% on room air. In the ER the patient's workup including CT scan of the head as well as chest x-ray was  negative. BMP showed patient's sodium was 152 and creatinine was 2.12, baseline 1-1.5. WBC count was normal at 9.4     Review of Systems: negative for the following  Unable to perform ROS: Dementia , pertinent positives obtained from history of present illness    Past Medical History  Diagnosis Date  . Dementia   . BPH (benign prostatic hypertrophy)   . Hyperlipidemia      History reviewed. No pertinent past surgical history.    Social History:  reports that he has never smoked. He does not have any smokeless tobacco history on file. He reports that he does not drink alcohol or use illicit drugs.    No Known Allergies  No family history on file.                            Take 40 mg by mouth daily with breakfast.    Yes Historical Provider, MD                                                                     Physical Exam: Filed Vitals:   01/15/14 0549 01/15/14 1518 01/15/14 2153 01/16/14 0500  BP: 130/70 118/68 124/52 127/72  Pulse: 94 90 86 87  Temp: 97.5 F (36.4 C) 97.5 F (36.4 C) 97.7 F (36.5 C) 98 F (36.7 C)  TempSrc: Oral Oral Oral Oral  Resp: 18 18 20 20   Height:      Weight:  SpO2: 95% 96% 96% 95%     Constitutional: Vital signs reviewed. Patient is a well-developed and well-nourished in no acute distress and cooperative with exam. Confused  Head: Normocephalic and atraumatic  Ear: TM normal bilaterally  Mouth: no erythema or exudates, MMM  Eyes: PERRL, EOMI, conjunctivae normal, No scleral icterus.  Neck: Supple, Trachea midline normal ROM, No JVD, mass, thyromegaly, or carotid bruit present.  Cardiovascular: RRR, S1 normal, S2 normal, no MRG, pulses symmetric and intact bilaterally  Pulmonary/Chest: CTAB, no wheezes, rales, or rhonchi  Abdominal: Soft. Non-tender, non-distended, bowel sounds are normal, no masses, organomegaly, or guarding present.  GU: no CVA tenderness Musculoskeletal: No joint deformities, erythema, or  stiffness, ROM full and no nontender Ext: no edema and no cyanosis, pulses palpable bilaterally (DP and PT)  Hematology: no cervical, inginal, or axillary adenopathy.  Neurological: He is alert. He has normal strength. No cranial nerve deficit (no facial droop, extraocular movements intact, no slurred speech) or sensory deficit. He exhibits normal muscle tone. He displays no seizure activity. Coordination normal. GCS eye subscore is 4. GCS verbal subscore is 4. GCS motor subscore is 6.  Patient is able to move all 4 extremities, no drift bilateral upper and lower lower extremities, no facial droop,  Skin: Warm, dry and intact. No rash, cyanosis, or clubbing.  Psychiatric: Normal mood and affect. speech and behavior is normal. Judgment and thought content normal. Cognition and memory are normal.       Labs on Admission:    Basic Metabolic Panel:  Recent Labs Lab 01/14/14 1420 01/14/14 1900 01/15/14 0125 01/16/14 0412  NA 152*  --  154* 151*  K 5.0  --  4.7 4.1  CL 113*  --  119* 117*  CO2 25  --  26 26  GLUCOSE 515*  --  129* 171*  BUN 63*  --  56* 34*  CREATININE 2.12*  --  1.86* 1.55*  CALCIUM 9.6  --  9.3 9.0  MG  --  2.6*  --   --    Liver Function Tests:  Recent Labs Lab 01/14/14 1420 01/15/14 0125 01/16/14 0412  AST 11 16 11   ALT 10 8 6   ALKPHOS 76 56 46  BILITOT 0.3 0.3 0.4  PROT 7.3 6.4 6.2  ALBUMIN 2.9* 2.5* 2.4*   No results for input(s): LIPASE, AMYLASE in the last 168 hours. No results for input(s): AMMONIA in the last 168 hours. CBC:  Recent Labs Lab 01/14/14 1420 01/15/14 0125  WBC 9.4 9.4  NEUTROABS 7.4  --   HGB 14.5 12.9*  HCT 46.3 42.4  MCV 94.5 94.9  PLT 138* 138*   Cardiac Enzymes:  Recent Labs Lab 01/14/14 1900 01/15/14 0125  TROPONINI <0.30 <0.30    BNP (last 3 results)  Recent Labs  01/01/14 0328  PROBNP 1228.0*      CBG:  Recent Labs Lab 01/15/14 0735 01/15/14 1158 01/15/14 1740 01/15/14 2149  01/16/14 0738  GLUCAP 165* 296* 203* 138* 146*    Radiological Exams on Admission: Dg Chest 1 View  01/14/2014   CLINICAL DATA:  Hyperglycemia, decreased responsiveness  EXAM: CHEST - 1 VIEW  COMPARISON:  12/31/2013  FINDINGS: Cardiac shadow is stable. Patchy bronchitic changes are noted bilaterally more marked in the bases which have increased from the prior exam. No focal confluent infiltrate is seen.  IMPRESSION: Increased bronchitic changes which are increased in the basis when compared with the prior exam.   Electronically Signed   By: Loraine LericheMark  Lukens M.D.   On: 01/14/2014 14:34   Ct Head Wo Contrast  01/14/2014   CLINICAL DATA:  Altered mental status.  Dementia.  Hyperglycemia.  EXAM: CT HEAD WITHOUT CONTRAST  TECHNIQUE: Contiguous axial images were obtained from the base of the skull through the vertex without intravenous contrast.  COMPARISON:  07/15/2013  FINDINGS: There is no evidence of intracranial hemorrhage, brain edema, or other signs of acute infarction. There is no evidence of intracranial mass lesion or mass effect. No abnormal extraaxial fluid collections are identified.  Moderate diffuse cerebral atrophy is stable. Ventricles are stable in size. No skull abnormality identified.  IMPRESSION: No acute intracranial abnormality.  Stable cerebral atrophy.   Electronically Signed   By: Myles Rosenthal M.D.   On: 01/14/2014 14:13   US Renal  01/14/2014   CLINICAL DATA:  Acute renal failure.  EXAM: RENAL/URINARY TRACT ULTRASOUND COMPLETE  COMPARISON:  None.  FINDINGS: Right Kidney:  Length: 9.0 cm, limited visualization. Echogenicity within normal limits. No mass or hydronephrosis visualized.  Left Kidney:  Length: 11.7 cm. 6.8 cm simple appearing cyst noted laterally off the midpole. Echogenicity within normal limits. No mass or hydronephrosis visualized.  Bladder:  Appears normal for degree of bladder distention.  IMPRESSION: No evidence of hydronephrosis.  No acute findings.   Electronically  Signed   By: Charlett Nose M.D.   On: 01/14/2014 18:06    EKG: Independently reviewed.    Assessment/Plan Active Problems:   Hyperglycemia   Type 2 diabetes mellitus with hyperosmolar nonketotic hyperglycemia    Code Status:   full Family Communication: bedside Disposition Plan: admit   Time spent: 70 mins   [facility] Pager [phone]  If 7PM-7AM, please contact night-coverage [email or web address] Password [facility] 01/16/2014, 10:41 AM

## 2014-01-17 LAB — GLUCOSE, CAPILLARY
GLUCOSE-CAPILLARY: 146 mg/dL — AB (ref 70–99)
Glucose-Capillary: 243 mg/dL — ABNORMAL HIGH (ref 70–99)

## 2014-01-17 LAB — BASIC METABOLIC PANEL
Anion gap: 9 (ref 5–15)
BUN: 27 mg/dL — ABNORMAL HIGH (ref 6–23)
CALCIUM: 8.5 mg/dL (ref 8.4–10.5)
CO2: 24 meq/L (ref 19–32)
CREATININE: 1.41 mg/dL — AB (ref 0.50–1.35)
Chloride: 111 mEq/L (ref 96–112)
GFR calc Af Amer: 51 mL/min — ABNORMAL LOW (ref >90)
GFR, EST NON AFRICAN AMERICAN: 44 mL/min — AB (ref >90)
GLUCOSE: 182 mg/dL — AB (ref 70–99)
Potassium: 4 mEq/L (ref 3.7–5.3)
Sodium: 144 mEq/L (ref 137–147)

## 2014-01-17 MED ORDER — INSULIN ASPART 100 UNIT/ML ~~LOC~~ SOLN: SUBCUTANEOUS | Status: DC

## 2014-01-17 MED ORDER — INSULIN GLARGINE 100 UNIT/ML ~~LOC~~ SOLN: 20.0000 [IU] | SUBCUTANEOUS | Status: DC

## 2014-01-17 NOTE — Clinical Social Work Note (Signed)
MD wanted pt discharged today  Pt's family wanted pt to go back to [facility]  CSW discussed PT evalustion with both family and MD which indicated pt could benefit from SNF unless facility he resided at ([facility]) could accommodate him  MD stated she was discharging Pt and it did not mattter where he was being discharged to.   MD stated she wanted to sign the FL2 and leave the hospital.  MD stated that she would order home health PT for the pt.  CSW faxed [facility] physical therapy evaluation and discharge summary and spoke with Pansy at [facility]  who stated that she could accommodate pt with sitters  for 48 hours until they could assess his PT regimen  Pansy also stated that she needed daughter to  pick up pt's  Insulin and bring to the facility  CSW spoke with both daughters susan and Dewayne Hatchnn and Dewayne Hatchnn stated that she would spend the night tonight at facility with pt and she would pick up the insulin today  CSW spoke with case manger Sarah and relayed information about home health PT telling her that the facility stated that they use [facility] but family stated they use [facility].  Case manger stated she might not be able to get the home health until Monday  CSW presented discharge packet to sisters with insulin prescriptions and informed them of home health PT for pt at facility that might not start until Monday   CSW spokie with Sharday at facility to confirm a sitter would be needed in the morning to relieve the daughter Dewayne Hatchnn.  CSW called for transport.  No further CSW needs  CSW signing off.  Elray Bubaegina Letta Cargile, LCSW [facility] Clinical Social Worker - Weekend Coverage cell #: [phone]

## 2014-01-17 NOTE — Progress Notes (Signed)
Pt for d/c to ALF.... SW set up transportation.  No change in initial am assessment at this time. Awaiting EMS arrival

## 2014-01-17 NOTE — Evaluation (Signed)
Occupational Therapy Evaluation Patient Details Name: Brett FastRobert Delbert Mckee MRN: [id number] DOB: 12/23/1926 Today's Date: 01/17/2014    History of Present Illness 80 year old male recently admitted 11/9-11/13 for Escherichia coli urosepsis admitted 01/14/14 from memory care unit of [facility]  [place] with altered mental status and hyperosmolar nonketotic hyperglycemia.   Clinical Impression   Pt admitted with urosepsis. Pt currently with functional limitations due to the deficits listed below (see OT Problem List).  Pt will benefit from skilled OT to increase their safety and independence with ADL and functional mobility for ADL to facilitate discharge to venue listed below.      Follow Up Recommendations  SNF;Other (comment) (or increased A at ALF)    Equipment Recommendations  None recommended by OT    Recommendations for Other Services       Precautions / Restrictions Precautions Precautions: Fall Precaution Comments: confusion Restrictions Weight Bearing Restrictions: No      Mobility Bed Mobility Overal bed mobility: Needs Assistance;+2 for physical assistance Bed Mobility: Supine to Sit;Sit to Supine     Supine to sit: +2 for physical assistance;Max assist Sit to supine: Mod assist   General bed mobility comments: increased assist possibly due to decreased cognition, pt not following commands or mulitmodal cues so assisted to sitting  Transfers       Sit to Stand: Max assist;+2 physical assistance         General transfer comment: Did not stand with pt- only sat EOB    Balance Overall balance assessment: Needs assistance Sitting-balance support: Feet supported;Bilateral upper extremity supported Sitting balance-Leahy Scale: Good   Postural control: Posterior lean                                  ADL Overall ADL's : Needs assistance/impaired     Grooming: Wash/dry face;Sitting;Minimal assistance Grooming Details (indicate cue  type and reason): sitting balance- S- max A                               General ADL Comments: Pt sat EOB approx 20 min with differing levels of A with sitting balace.   Pt easy to redirect but needed max VC               Pertinent Vitals/Pain Pain Assessment: Faces Pain Score: 6  Pain Location: winced with lower leg pain when getting legs back on bed Pain Intervention(s): Monitored during session;Repositioned     Hand Dominance     Extremity/Trunk Assessment Upper Extremity Assessment Upper Extremity Assessment: Generalized weakness           Communication Communication Communication: No difficulties   Cognition Arousal/Alertness: Awake/alert Behavior During Therapy: WFL for tasks assessed/performed Overall Cognitive Status: History of cognitive impairments - at baseline                                Home Living Family/patient expects to be discharged to:: Assisted living                                        Prior Functioning/Environment Level of Independence: Needs assistance        Comments: assist OOB due to cognition, ambulates with RW to dining  hall, assist for ADLs per daughter    OT Diagnosis: Generalized weakness;Cognitive deficits   OT Problem List: Decreased strength;Decreased activity tolerance;Decreased cognition;Decreased safety awareness;Impaired balance (sitting and/or standing)   OT Treatment/Interventions: Self-care/ADL training;Patient/family education    OT Goals(Current goals can be found in the care plan section) Acute Rehab OT Goals Patient Stated Goal: per daughter- go back to ALF if able OT Goal Formulation: With family  OT Frequency: Min 2X/week              End of Session Nurse Communication: Mobility status  Activity Tolerance: Patient tolerated treatment well Patient left: in bed;with family/visitor present;with bed alarm set   Time: 1049-1120 OT Time Calculation (min): 31  min Charges:  OT General Charges $OT Visit: 1 Procedure OT Evaluation $Initial OT Evaluation Tier I: 1 Procedure OT Treatments $Self Care/Home Management : 23-37 mins G-Codes:    Brett CrowEDDING, Brett Mckee 01/17/2014, 11:58 AM

## 2014-01-17 NOTE — Discharge Summary (Addendum)
Physician Discharge Summary  Brett Mckee MRN: [id number] DOB/AGE: 78-Nov-1928 78 y.o.  PCP: Horton Finer, MD   Admit date: 01/14/2014 Discharge date: 01/17/2014  Discharge Diagnoses:  Altered mental status secondary to metabolic encephalopathy   Hyperglycemia   Type 2 diabetes mellitus with hyperosmolar nonketotic hyperglycemia Dehydration Acute on chronic renal failure improving    Follow-up recommendations Follow-up with PCP in 5-7 days Follow-up CBC, BMP in one week Encourage patient to drink free fluid to maintain appropriate sodium levels     Medication List    STOP taking these medications        glipiZIDE 5 MG tablet  Commonly known as:  GLUCOTROL     Melatonin 3 MG Tabs     traMADol 50 MG tablet  Commonly known as:  ULTRAM      TAKE these medications        BAZA PROTECT EX  Apply 1 application topically as needed (Apply to affected area after each incontinent episode. Notify MD if symptoms persist over a week.).     donepezil 10 MG tablet  Commonly known as:  ARICEPT  Take 10 mg by mouth at bedtime.     furosemide 40 MG tablet  Commonly known as:  LASIX  Take 40 mg by mouth daily with breakfast.     insulin glargine 100 UNIT/ML injection  Commonly known as:  LANTUS  Inject 0.2 mLs (20 Units total) into the skin daily.     LORazepam 0.5 MG tablet  Commonly known as:  ATIVAN  Take 0.5 mg by mouth every 6 (six) hours as needed (for anxiety / agitation).     potassium chloride SA 20 MEQ tablet  Commonly known as:  K-DUR,KLOR-CON  Take 20 mEq by mouth daily.     ranitidine 75 MG tablet  Commonly known as:  ZANTAC  Take 150 mg by mouth daily.     RESOURCE THICKENUP CLEAR Powd  Use as needed to thicken thin liquids for swallowing     TYLENOL 500 MG tablet  Generic drug:  acetaminophen  Take 1,000 mg by mouth 2 (two) times daily.        Discharge Condition: Stable Disposition: 03-Skilled Nursing  Facility   Consults:  None   Significant Diagnostic Studies: Dg Chest 1 View  01/14/2014   CLINICAL DATA:  Hyperglycemia, decreased responsiveness  EXAM: CHEST - 1 VIEW  COMPARISON:  12/31/2013  FINDINGS: Cardiac shadow is stable. Patchy bronchitic changes are noted bilaterally more marked in the bases which have increased from the prior exam. No focal confluent infiltrate is seen.  IMPRESSION: Increased bronchitic changes which are increased in the basis when compared with the prior exam.   Electronically Signed   By: Inez Catalina M.D.   On: 01/14/2014 14:34   Dg Chest 2 View  12/29/2013   CLINICAL DATA:  Fever.  Initial encounter  EXAM: CHEST  2 VIEW  COMPARISON:  12/01/2012  FINDINGS: Significantly limited study due to hypoaeration.  Chronic cardiomegaly, aortic tortuosity, and vascular pedicle widening. No overt edema, asymmetric opacity, or definitive effusion. No pneumothorax.  IMPRESSION: Very low lung volumes, which limits sensitivity. No definitive pneumonia.   Electronically Signed   By: Jorje Guild M.D.   On: 12/29/2013 13:38   Dg Knee 1-2 Views Right  01/01/2014   CLINICAL DATA:  Right knee pain  EXAM: RIGHT KNEE - 1-2 VIEW  COMPARISON:  None.  FINDINGS: Two views of the right knee submitted. Significant diffuse narrowing  of joint space. Mild spurring of femoral condyles. Mild spurring of tibial plateau. Significant narrowing of patellofemoral joint space. Spurring of patella. No acute fracture or subluxation. Small joint effusion.  IMPRESSION: No acute fracture or subluxation. Significant osteoarthritic changes.   Electronically Signed   By: Lahoma Crocker M.D.   On: 01/01/2014 16:28   Ct Head Wo Contrast  01/14/2014   CLINICAL DATA:  Altered mental status.  Dementia.  Hyperglycemia.  EXAM: CT HEAD WITHOUT CONTRAST  TECHNIQUE: Contiguous axial images were obtained from the base of the skull through the vertex without intravenous contrast.  COMPARISON:  07/15/2013  FINDINGS: There is  no evidence of intracranial hemorrhage, brain edema, or other signs of acute infarction. There is no evidence of intracranial mass lesion or mass effect. No abnormal extraaxial fluid collections are identified.  Moderate diffuse cerebral atrophy is stable. Ventricles are stable in size. No skull abnormality identified.  IMPRESSION: No acute intracranial abnormality.  Stable cerebral atrophy.   Electronically Signed   By: Earle Gell M.D.   On: 01/14/2014 14:13   Ct Chest Wo Contrast  12/29/2013   CLINICAL DATA:  Cough and shortness of breath.  EXAM: CT CHEST WITHOUT CONTRAST  TECHNIQUE: Multidetector CT imaging of the chest was performed following the standard protocol without IV contrast.  COMPARISON:  None.  FINDINGS: THORACIC INLET/BODY WALL:  No acute abnormality.  MEDIASTINUM:  Cardiomegaly. No pericardial effusion. Aortic and great vessel atherosclerosis. No evidence of acute vascular abnormality. No lymphadenopathy. Negative esophagus.  LUNG WINDOWS:  Very low lung volumes with mild basilar atelectasis. There is no edema, effusion, pneumothorax, or definitive pneumonia. As permitted by patient respiratory motion, no definite pulmonary nodule. There is tiny calcifications in the peripheral lower lobes bilaterally.  UPPER ABDOMEN:  Coarse curvilinear calcification in the region of the gallbladder fundus. This is most likely gallbladder wall calcification (which is associated with increased risk of cholangiocarcinoma), although a peripherally calcified, exophytic liver cyst or a calcified gallstone are diagnostic possibilities. There is a homogeneous, minimally imaged lesion in the left pararenal area which is likely a exophytic cyst. Hepatic steatosis.  OSSEOUS:  No acute fracture.  No suspicious lytic or blastic lesions.  IMPRESSION: 1. Negative for pneumonia. 2. Low lung volumes with bilateral subsegmental atelectasis. 3. Calcification in the gallbladder fossa which favors porcelain gallbladder over  gallstone.   Electronically Signed   By: Jorje Guild M.D.   On: 12/29/2013 16:20   US Renal  01/14/2014   CLINICAL DATA:  Acute renal failure.  EXAM: RENAL/URINARY TRACT ULTRASOUND COMPLETE  COMPARISON:  None.  FINDINGS: Right Kidney:  Length: 9.0 cm, limited visualization. Echogenicity within normal limits. No mass or hydronephrosis visualized.  Left Kidney:  Length: 11.7 cm. 6.8 cm simple appearing cyst noted laterally off the midpole. Echogenicity within normal limits. No mass or hydronephrosis visualized.  Bladder:  Appears normal for degree of bladder distention.  IMPRESSION: No evidence of hydronephrosis.  No acute findings.   Electronically Signed   By: Rolm Baptise M.D.   On: 01/14/2014 18:06   Dg Chest Port 1 View  12/31/2013   CLINICAL DATA:  Wheezing, shortness of Breath  EXAM: PORTABLE CHEST - 1 VIEW  COMPARISON:  12/29/2013  FINDINGS: Cardiomediastinal silhouette is stable. Again noted elevation of the right hemidiaphragm with linear atelectasis right base. Question central mild bronchitic changes. No segmental infiltrate. Mild left basilar atelectasis.  IMPRESSION: No segmental infiltrate. Question central mild bronchitic changes. Mild basilar atelectasis. Chronic elevation of the  right hemidiaphragm.   Electronically Signed   By: Lahoma Crocker M.D.   On: 12/31/2013 11:51      Microbiology: No results found for this or any previous visit (from the past 240 hour(s)).   Labs: Results for orders placed or performed during the hospital encounter of 01/14/14 (from the past 48 hour(s))  Glucose, capillary     Status: Abnormal   Collection Time: 01/15/14 11:58 AM  Result Value Ref Range   Glucose-Capillary 296 (H) 70 - 99 mg/dL  Glucose, capillary     Status: Abnormal   Collection Time: 01/15/14  5:40 PM  Result Value Ref Range   Glucose-Capillary 203 (H) 70 - 99 mg/dL  Glucose, capillary     Status: Abnormal   Collection Time: 01/15/14  9:49 PM  Result Value Ref Range    Glucose-Capillary 138 (H) 70 - 99 mg/dL   Comment 1 Notify RN   Comprehensive metabolic panel     Status: Abnormal   Collection Time: 01/16/14  4:12 AM  Result Value Ref Range   Sodium 151 (H) 137 - 147 mEq/L   Potassium 4.1 3.7 - 5.3 mEq/L   Chloride 117 (H) 96 - 112 mEq/L   CO2 26 19 - 32 mEq/L   Glucose, Bld 171 (H) 70 - 99 mg/dL   BUN 34 (H) 6 - 23 mg/dL    Comment: DELTA CHECK NOTED REPEATED TO VERIFY    Creatinine, Ser 1.55 (H) 0.50 - 1.35 mg/dL   Calcium 9.0 8.4 - 10.5 mg/dL   Total Protein 6.2 6.0 - 8.3 g/dL   Albumin 2.4 (L) 3.5 - 5.2 g/dL   AST 11 0 - 37 U/L   ALT 6 0 - 53 U/L   Alkaline Phosphatase 46 39 - 117 U/L   Total Bilirubin 0.4 0.3 - 1.2 mg/dL   GFR calc non Af Amer 39 (L) >90 mL/min   GFR calc Af Amer 45 (L) >90 mL/min    Comment: (NOTE) The eGFR has been calculated using the CKD EPI equation. This calculation has not been validated in all clinical situations. eGFR's persistently <90 mL/min signify possible Chronic Kidney Disease.    Anion gap 8 5 - 15  Glucose, capillary     Status: Abnormal   Collection Time: 01/16/14  7:38 AM  Result Value Ref Range   Glucose-Capillary 146 (H) 70 - 99 mg/dL  Glucose, capillary     Status: Abnormal   Collection Time: 01/16/14 11:47 AM  Result Value Ref Range   Glucose-Capillary 143 (H) 70 - 99 mg/dL  Glucose, capillary     Status: Abnormal   Collection Time: 01/16/14  5:02 PM  Result Value Ref Range   Glucose-Capillary 176 (H) 70 - 99 mg/dL  Glucose, capillary     Status: Abnormal   Collection Time: 01/16/14  8:31 PM  Result Value Ref Range   Glucose-Capillary 248 (H) 70 - 99 mg/dL   Comment 1 Documented in Chart    Comment 2 Notify RN   Basic metabolic panel     Status: Abnormal   Collection Time: 01/17/14  4:38 AM  Result Value Ref Range   Sodium 144 137 - 147 mEq/L   Potassium 4.0 3.7 - 5.3 mEq/L   Chloride 111 96 - 112 mEq/L   CO2 24 19 - 32 mEq/L   Glucose, Bld 182 (H) 70 - 99 mg/dL   BUN 27 (H) 6 -  23 mg/dL   Creatinine, Ser 1.41 (H) 0.50 -  1.35 mg/dL   Calcium 8.5 8.4 - 10.5 mg/dL   GFR calc non Af Amer 44 (L) >90 mL/min   GFR calc Af Amer 51 (L) >90 mL/min    Comment: (NOTE) The eGFR has been calculated using the CKD EPI equation. This calculation has not been validated in all clinical situations. eGFR's persistently <90 mL/min signify possible Chronic Kidney Disease.    Anion gap 9 5 - 15  Glucose, capillary     Status: Abnormal   Collection Time: 01/17/14  7:57 AM  Result Value Ref Range   Glucose-Capillary 146 (H) 70 - 99 mg/dL     HPI :80 year old male recently admitted 11/9-11/13 for Escherichia coli urosepsis treated with Zosyn and levofloxacin, dehydration with acute renal failure, presents today from [facility] [place] with altered mental status, agitation, complaining of pain all over. Patient CBG was 600 at the facility. Per daughter was present by the bedside the patient's glipizide was discontinued on Monday and the patient was switched to Lantus, started on tramadol for chronic knee pain. No subjective fevers or shortness of breath. Patient confused and unable to give any history. Repeat CBG in the ER was 5:15. Vital signs at the nursing home her systolic blood pressure 1 20 .70, 96% on room air. In the ER the patient's workup including CT scan of the head as well as chest x-ray was negative. BMP showed patient's sodium was 152 and creatinine was 2.12, baseline 1-1.5. WBC count was normal at 9.4    HOSPITAL COURSE:  Acute metabolic encephalopathy This is likely secondary to hypernatremia, acute on chronic renal failure and hyperglycemia HypERnatremia is improving  Hyperosmolar nonketotic hyperglycemia Tapered off on an insulin drip, transitioned to Lantus and sliding scale insulin Recent hemoglobin A1c was 9.2 Glipizide discontinued last week admission Accu-Chek stable, continue hospital regimen   Recent Escherichia coli bacteremia Acute on chronic  renal failure Decrease half normal saline to 75 mL an hour,  Renal ultrasound completed No evidence of hydronephrosis   Acute on chronic CKD stage III -Baseline creatinine 1.1-1.5-improving since admission Diuretic was on hold now being resumed -  Dysphagia/aspiration pneumonia -speech therapist recommends Dys 2 diet with nectar thickened, during last admission -CXR--no infiltrates, chronic bronchitic changes -presently stable on room air   Discharge Exam:  Blood pressure 143/58, pulse 81, temperature 97.9 F (36.6 C), temperature source Oral, resp. rate 18, height $RemoveBe'5\' 10"'itndWCBuh$  (1.778 m), weight 96.525 kg (212 lb 12.8 oz), SpO2 95 %.  Constitutional: Vital signs reviewed. Patient is a well-developed and well-nourished in no acute distress and cooperative with exam. Confused  Head: Normocephalic and atraumatic  Ear: TM normal bilaterally  Mouth: no erythema or exudates, MMM  Eyes: PERRL, EOMI, conjunctivae normal, No scleral icterus.  Neck: Supple, Trachea midline normal ROM, No JVD, mass, thyromegaly, or carotid bruit present.  Cardiovascular: RRR, S1 normal, S2 normal, no MRG, pulses symmetric and intact bilaterally  Pulmonary/Chest: CTAB, no wheezes, rales, or rhonchi  Abdominal: Soft. Non-tender, non-distended, bowel sounds are normal, no masses, organomegaly, or guarding present.  GU: no CVA tenderness Musculoskeletal: No joint deformities, erythema, or stiffness, ROM full and no nontender Ext: no edema and no cyanosis, pulses palpable bilaterally (DP and PT)  Hematology: no cervical, inginal, or axillary adenopathy.  Neurological: He is alert. He has normal strength. No cranial nerve deficit (no facial droop, extraocular movements intact, no slurred speech) or sensory deficit. He exhibits normal muscle tone. He displays no seizure activity. Coordination normal. GCS eye subscore  is 4. GCS verbal subscore is 4. GCS motor subscore is 6.  Patient is able to move all 4  extremities, no drift bilateral upper and lower lower extremities, no facial droop,  Skin: Warm, dry and intact. No rash, cyanosis, or clubbing.  Psychiatric: Normal mood and affect. speech and behavior is normal. Judgment and thought content normal. Cognition and memory are normal.          Discharge Instructions    Diet - low sodium heart healthy    Complete by:  As directed      Increase activity slowly    Complete by:  As directed            Follow-up Information    Follow up with Horton Finer, MD. Schedule an appointment as soon as possible for a visit in 1 week.   Specialty:  Internal Medicine   Contact information:   [address], [address] [place] [address] [phone]        Signed: Reyne Dumas 01/17/2014, 10:57 AM

## 2014-01-18 NOTE — Care Management Note (Signed)
    Page 1 of 1   01/18/2014     11:29:15 AM CARE MANAGEMENT NOTE 01/18/2014  Patient:  Brett Mckee,Brett Mckee   Account Number:  [id number]  Date Initiated:  01/17/2014  Documentation initiated by:  [facility],Shaniqua Guillot  Subjective/Objective Assessment:   adm: Altered mental status secondary to metabolic encephalopathy    Hyperglycemia     Action/Plan:   discharge planning   Anticipated DC Date:  01/17/2014   Anticipated DC Plan:  ASSISTED LIVING / REST HOME      DC Planning Services  CM consult      Choice offered to / List presented to:          [facility]  HH-1 RN  HH-2 PT  HH-3 OT      Status of service:  Completed, signed off Medicare Important Message given?   (If response is "NO", the following Medicare IM given date fields will be blank) Date Medicare IM given:   Medicare IM given by:   Date Additional Medicare IM given:   Additional Medicare IM given by:    Discharge Disposition:  ASSISTED LIVING  Per UR Regulation:    If discussed at Long Length of Stay Meetings, dates discussed:    Comments:  01/18/14 11:00 CM called SA and spoke with Marlon PelJolesia who requests I fax Orders, Facesheet, F2F, H&P, and DC Summary to [phone] [facility]  CM faxed reqeusted information.  No other CM needs were communicated.  Freddy Jakschsarah Karinda Cabriales, BSN, [place] [phone].  01/17/14 18:00 Cm received call from RN stating pt [facility]  CM states [facility] not available at 18:00 on a Saturday night to arrange but will pick this up in the am.  Freddy JakschSarah Anishka Bushard, BSN, CM [phone].

## 2014-01-24 ENCOUNTER — Emergency Department (HOSPITAL_COMMUNITY): Payer: Medicare Other

## 2014-01-24 ENCOUNTER — Encounter (HOSPITAL_COMMUNITY): Payer: Self-pay | Admitting: Emergency Medicine

## 2014-01-24 ENCOUNTER — Emergency Department (HOSPITAL_COMMUNITY)
Admission: EM | Admit: 2014-01-24 | Discharge: 2014-01-24 | Disposition: A | Discharge status: Home / Self Care | Payer: Medicare Other | Attending: Emergency Medicine | Admitting: Emergency Medicine

## 2014-01-24 DIAGNOSIS — Y9301 Activity, walking, marching and hiking: Secondary | ICD-10-CM | POA: Insufficient documentation

## 2014-01-24 DIAGNOSIS — Z79899 Other long term (current) drug therapy: Secondary | ICD-10-CM | POA: Diagnosis not present

## 2014-01-24 DIAGNOSIS — W01198A Fall on same level from slipping, tripping and stumbling with subsequent striking against other object, initial encounter: Secondary | ICD-10-CM | POA: Diagnosis not present

## 2014-01-24 DIAGNOSIS — R600 Localized edema: Secondary | ICD-10-CM | POA: Diagnosis not present

## 2014-01-24 DIAGNOSIS — M549 Dorsalgia, unspecified: Secondary | ICD-10-CM

## 2014-01-24 DIAGNOSIS — S3992XA Unspecified injury of lower back, initial encounter: Secondary | ICD-10-CM | POA: Insufficient documentation

## 2014-01-24 DIAGNOSIS — Z8639 Personal history of other endocrine, nutritional and metabolic disease: Secondary | ICD-10-CM | POA: Diagnosis not present

## 2014-01-24 DIAGNOSIS — F0390 Unspecified dementia without behavioral disturbance: Secondary | ICD-10-CM | POA: Insufficient documentation

## 2014-01-24 DIAGNOSIS — S0990XA Unspecified injury of head, initial encounter: Secondary | ICD-10-CM | POA: Diagnosis not present

## 2014-01-24 DIAGNOSIS — Y92128 Other place in nursing home as the place of occurrence of the external cause: Secondary | ICD-10-CM | POA: Insufficient documentation

## 2014-01-24 DIAGNOSIS — Y998 Other external cause status: Secondary | ICD-10-CM | POA: Diagnosis not present

## 2014-01-24 DIAGNOSIS — Z794 Long term (current) use of insulin: Secondary | ICD-10-CM | POA: Diagnosis not present

## 2014-01-24 DIAGNOSIS — Z87438 Personal history of other diseases of male genital organs: Secondary | ICD-10-CM | POA: Insufficient documentation

## 2014-01-24 DIAGNOSIS — W19XXXA Unspecified fall, initial encounter: Secondary | ICD-10-CM

## 2014-01-24 MED ORDER — ACETAMINOPHEN 325 MG PO TABS
650.0000 mg | ORAL_TABLET | Freq: Once | ORAL | Status: AC
  Administered 2014-01-24: 650 mg via ORAL
  Filled 2014-01-24: qty 2

## 2014-01-24 NOTE — ED Notes (Signed)
Pt able to ambulate in hallway with 2 people assist. Dr. Micheline Mazeocherty aware.

## 2014-01-24 NOTE — ED Notes (Signed)
Pt family at bedside

## 2014-01-24 NOTE — Discharge Instructions (Signed)
Fall Prevention and Home Safety Falls cause injuries and can affect all age groups. It is possible to use preventive measures to significantly decrease the likelihood of falls. There are many simple measures which can make your home safer and prevent falls. OUTDOORS  Repair cracks and edges of walkways and driveways.  Remove high doorway thresholds.  Trim shrubbery on the main path into your home.  Have good outside lighting.  Clear walkways of tools, rocks, debris, and clutter.  Check that handrails are not broken and are securely fastened. Both sides of steps should have handrails.  Have leaves, snow, and ice cleared regularly.  Use sand or salt on walkways during winter months.  In the garage, clean up grease or oil spills. BATHROOM  Install night lights.  Install grab bars by the toilet and in the tub and shower.  Use non-skid mats or decals in the tub or shower.  Place a plastic non-slip stool in the shower to sit on, if needed.  Keep floors dry and clean up all water on the floor immediately.  Remove soap buildup in the tub or shower on a regular basis.  Secure bath mats with non-slip, double-sided rug tape.  Remove throw rugs and tripping hazards from the floors. BEDROOMS  Install night lights.  Make sure a bedside light is easy to reach.  Do not use oversized bedding.  Keep a telephone by your bedside.  Have a firm chair with side arms to use for getting dressed.  Remove throw rugs and tripping hazards from the floor. KITCHEN  Keep handles on pots and pans turned toward the center of the stove. Use back burners when possible.  Clean up spills quickly and allow time for drying.  Avoid walking on wet floors.  Avoid hot utensils and knives.  Position shelves so they are not too high or low.  Place commonly used objects within easy reach.  If necessary, use a sturdy step stool with a grab bar when reaching.  Keep electrical cables out of the  way.  Do not use floor polish or wax that makes floors slippery. If you must use wax, use non-skid floor wax.  Remove throw rugs and tripping hazards from the floor. STAIRWAYS  Never leave objects on stairs.  Place handrails on both sides of stairways and use them. Fix any loose handrails. Make sure handrails on both sides of the stairways are as long as the stairs.  Check carpeting to make sure it is firmly attached along stairs. Make repairs to worn or loose carpet promptly.  Avoid placing throw rugs at the top or bottom of stairways, or properly secure the rug with carpet tape to prevent slippage. Get rid of throw rugs, if possible.  Have an electrician put in a light switch at the top and bottom of the stairs. OTHER FALL PREVENTION TIPS  Wear low-heel or rubber-soled shoes that are supportive and fit well. Wear closed toe shoes.  When using a stepladder, make sure it is fully opened and both spreaders are firmly locked. Do not climb a closed stepladder.  Add color or contrast paint or tape to grab bars and handrails in your home. Place contrasting color strips on first and last steps.  Learn and use mobility aids as needed. Install an electrical emergency response system.  Turn on lights to avoid dark areas. Replace light bulbs that burn out immediately. Get light switches that glow.  Arrange furniture to create clear pathways. Keep furniture in the same place.  Firmly attach carpet with non-skid or double-sided tape.  Eliminate uneven floor surfaces.  Select a carpet pattern that does not visually hide the edge of steps.  Be aware of all pets. OTHER HOME SAFETY TIPS  Set the water temperature for 120 F (48.8 C).  Keep emergency numbers on or near the telephone.  Keep smoke detectors on every level of the home and near sleeping areas. Document Released: 01/27/2002 Document Revised: 08/08/2011 Document Reviewed: 04/28/2011 [facility] Patient Information 2015  [place], [place]. This information is not intended to replace advice given to you by your health care provider. Make sure you discuss any questions you have with your health care provider.   Back Pain, Adult Low back pain is very common. About 1 in 5 people have back pain.The cause of low back pain is rarely dangerous. The pain often gets better over time.About half of people with a sudden onset of back pain feel better in just 2 weeks. About 8 in 10 people feel better by 6 weeks.  CAUSES Some common causes of back pain include:  Strain of the muscles or ligaments supporting the spine.  Wear and tear (degeneration) of the spinal discs.  Arthritis.  Direct injury to the back. DIAGNOSIS Most of the time, the direct cause of low back pain is not known.However, back pain can be treated effectively even when the exact cause of the pain is unknown.Answering your caregiver's questions about your overall health and symptoms is one of the most accurate ways to make sure the cause of your pain is not dangerous. If your caregiver needs more information, he or she may order lab work or imaging tests (X-rays or MRIs).However, even if imaging tests show changes in your back, this usually does not require surgery. HOME CARE INSTRUCTIONS For many people, back pain returns.Since low back pain is rarely dangerous, it is often a condition that people can learn to [facility]manageon their own.   Remain active. It is stressful on the back to sit or stand in one place. Do not sit, drive, or stand in one place for more than 30 minutes at a time. Take short walks on level surfaces as soon as pain allows.Try to increase the length of time you walk each day.  Do not stay in bed.Resting more than 1 or 2 days can delay your recovery.  Do not avoid exercise or work.Your body is made to move.It is not dangerous to be active, even though your back may hurt.Your back will likely heal faster if you return to being active  before your pain is gone.  Pay attention to your body when you bend and lift. Many people have less discomfortwhen lifting if they bend their knees, keep the load close to their bodies,and avoid twisting. Often, the most comfortable positions are those that put less stress on your recovering back.  Find a comfortable position to sleep. Use a firm mattress and lie on your side with your knees slightly bent. If you lie on your back, put a pillow under your knees.  Only take over-the-counter or prescription medicines as directed by your caregiver. Over-the-counter medicines to reduce pain and inflammation are often the most helpful.Your caregiver may prescribe muscle relaxant drugs.These medicines help dull your pain so you can more quickly return to your normal activities and healthy exercise.  Put ice on the injured area.  Put ice in a plastic bag.  Place a towel between your skin and the bag.  Leave the ice on for  15-20 minutes, 03-04 times a day for the first 2 to 3 days. After that, ice and heat may be alternated to reduce pain and spasms.  Ask your caregiver about trying back exercises and gentle massage. This may be of some benefit.  Avoid feeling anxious or stressed.Stress increases muscle tension and can worsen back pain.It is important to recognize when you are anxious or stressed and learn ways to manage it.Exercise is a great option. SEEK MEDICAL CARE IF:  You have pain that is not relieved with rest or medicine.  You have pain that does not improve in 1 week.  You have new symptoms.  You are generally not feeling well. SEEK IMMEDIATE MEDICAL CARE IF:   You have pain that radiates from your back into your legs.  You develop new bowel or bladder control problems.  You have unusual weakness or numbness in your arms or legs.  You develop nausea or vomiting.  You develop abdominal pain.  You feel faint. Document Released: 02/06/2005 Document Revised: 08/08/2011  Document Reviewed: 06/10/2013 [facility] Patient Information 2015 [place], [place]. This information is not intended to replace advice given to you by your health care provider. Make sure you discuss any questions you have with your health care provider.  Head Injury You have received a head injury. It does not appear serious at this time. Headaches and vomiting are common following head injury. It should be easy to awaken from sleeping. Sometimes it is necessary for you to stay in the emergency department for a while for observation. Sometimes admission to the hospital may be needed. After injuries such as yours, most problems occur within the first 24 hours, but side effects may occur up to 7-10 days after the injury. It is important for you to carefully monitor your condition and contact your health care provider or seek immediate medical care if there is a change in your condition. WHAT ARE THE TYPES OF HEAD INJURIES? Head injuries can be as minor as a bump. Some head injuries can be more severe. More severe head injuries include:  A jarring injury to the brain (concussion).  A bruise of the brain (contusion). This mean there is bleeding in the brain that can cause swelling.  A cracked skull (skull fracture).  Bleeding in the brain that collects, clots, and forms a bump (hematoma). WHAT CAUSES A HEAD INJURY? A serious head injury is most likely to happen to someone who is in a car wreck and is not wearing a seat belt. Other causes of major head injuries include bicycle or motorcycle accidents, sports injuries, and falls. HOW ARE HEAD INJURIES DIAGNOSED? A complete history of the event leading to the injury and your current symptoms will be helpful in diagnosing head injuries. Many times, pictures of the brain, such as CT or MRI are needed to see the extent of the injury. Often, an overnight hospital stay is necessary for observation.  WHEN SHOULD I SEEK IMMEDIATE MEDICAL CARE?  You should get  help right away if:  You have confusion or drowsiness.  You feel sick to your stomach (nauseous) or have continued, forceful vomiting.  You have dizziness or unsteadiness that is getting worse.  You have severe, continued headaches not relieved by medicine. Only take over-the-counter or prescription medicines for pain, fever, or discomfort as directed by your health care provider.  You do not have normal function of the arms or legs or are unable to walk.  You notice changes in the black spots in  the center of the colored part of your eye (pupil).  You have a clear or bloody fluid coming from your nose or ears.  You have a loss of vision. During the next 24 hours after the injury, you must stay with someone who can watch you for the warning signs. This person should contact local emergency services (911 in the [place] if you have seizures, you become unconscious, or you are unable to wake up. HOW CAN I PREVENT A HEAD INJURY IN THE FUTURE? The most important factor for preventing major head injuries is avoiding motor vehicle accidents. To minimize the potential for damage to your head, it is crucial to wear seat belts while riding in motor vehicles. Wearing helmets while bike riding and playing collision sports (like football) is also helpful. Also, avoiding dangerous activities around the house will further help reduce your risk of head injury.  WHEN CAN I RETURN TO NORMAL ACTIVITIES AND ATHLETICS? You should be reevaluated by your health care provider before returning to these activities. If you have any of the following symptoms, you should not return to activities or contact sports until 1 week after the symptoms have stopped:  Persistent headache.  Dizziness or vertigo.  Poor attention and concentration.  Confusion.  Memory problems.  Nausea or vomiting.  Fatigue or tire easily.  Irritability.  Intolerant of bright lights or loud noises.  Anxiety or  depression.  Disturbed sleep. MAKE SURE YOU:   Understand these instructions.  Will watch your condition.  Will get help right away if you are not doing well or get worse. Document Released: 02/06/2005 Document Revised: 02/11/2013 Document Reviewed: 10/14/2012 [facility] Patient Information 2015 [place], [place]. This information is not intended to replace advice given to you by your health care provider. Make sure you discuss any questions you have with your health care provider.

## 2014-01-24 NOTE — ED Notes (Signed)
[facility] here to take pt back to [facility].

## 2014-01-24 NOTE — ED Notes (Signed)
Report to Rosalita ChessmanSuzanne, Charity fundraiserN at [facility].

## 2014-01-24 NOTE — ED Provider Notes (Signed)
CSN: [id number]     Arrival date & time 01/24/14  1720 History   First MD Initiated Contact with Patient 01/24/14 1726     Chief Complaint  Patient presents with  . Fall     (Consider location/radiation/quality/duration/timing/severity/associated sxs/prior Treatment) Patient is a 80 y.o. male presenting with fall. The history is provided by the patient. No language interpreter was used.  Fall This is a recurrent problem. The current episode started 1 to 2 hours ago. The problem has been resolved. Pertinent negatives include no chest pain, no abdominal pain, no headaches and no shortness of breath. Associated symptoms comments: Back pain . Nothing aggravates the symptoms. Nothing relieves the symptoms. He has tried nothing for the symptoms. The treatment provided no relief.    Past Medical History  Diagnosis Date  . Dementia   . BPH (benign prostatic hypertrophy)   . Hyperlipidemia    History reviewed. No pertinent past surgical history. No family history on file. History  Substance Use Topics  . Smoking status: Never Smoker   . Smokeless tobacco: Not on file  . Alcohol Use: No    Review of Systems  Constitutional: Negative for fever, activity change, appetite change and fatigue.  HENT: Negative for congestion, facial swelling, rhinorrhea and trouble swallowing.   Eyes: Negative for photophobia and pain.  Respiratory: Negative for cough, chest tightness and shortness of breath.   Cardiovascular: Negative for chest pain and leg swelling.  Gastrointestinal: Negative for nausea, vomiting, abdominal pain, diarrhea and constipation.  Endocrine: Negative for polydipsia and polyuria.  Genitourinary: Negative for dysuria, urgency, decreased urine volume and difficulty urinating.  Musculoskeletal: Negative for back pain and gait problem.  Skin: Negative for color change, rash and wound.  Allergic/Immunologic: Negative for immunocompromised state.  Neurological: Negative for  dizziness, facial asymmetry, speech difficulty, weakness, numbness and headaches.  Psychiatric/Behavioral: Negative for confusion, decreased concentration and agitation.      Allergies  Review of patient's allergies indicates no known allergies.  Home Medications   Prior to Admission medications   Medication Sig Start Date End Date Taking? Authorizing Provider  acetaminophen (TYLENOL) 500 MG tablet Take 1,000 mg by mouth 2 (two) times daily.   Yes Historical Provider, MD  donepezil (ARICEPT) 10 MG tablet Take 10 mg by mouth at bedtime.    Yes Historical Provider, MD  furosemide (LASIX) 40 MG tablet Take 40 mg by mouth daily with breakfast.    Yes Historical Provider, MD  insulin aspart (NOVOLOG) 100 UNIT/ML injection Order Questions    Question Answer Comment   Correction coverage: Resistant (obese, steroids)    CBG < 70: implement hypoglycemia protocol    CBG 70 - 120: 0 units    CBG 121 - 150: 3 units    CBG 151 - 200: 4 units    CBG 201 - 250: 7 units    CBG 251 - 300: 11 units    CBG 301 - 350: 15 units    CBG 351 - 400: 20 units    CBG > 400 call MD and obtain STAT lab verification 01/17/14  Yes Richarda OverlieNayana Abrol, MD  insulin glargine (LANTUS) 100 UNIT/ML injection Inject 0.2 mLs (20 Units total) into the skin daily. 01/17/14  Yes Richarda OverlieNayana Abrol, MD  LORazepam (ATIVAN) 0.5 MG tablet Take 0.5 mg by mouth every 6 (six) hours as needed (for anxiety / agitation).    Yes Historical Provider, MD  potassium chloride SA (K-DUR,KLOR-CON) 20 MEQ tablet Take 20 mEq by mouth every  morning.    Yes Historical Provider, MD  ranitidine (ZANTAC) 75 MG tablet Take 150 mg by mouth every morning.    Yes Historical Provider, MD  Skin Protectants, Misc. (BAZA PROTECT EX) Apply 1 application topically as needed (Apply to affected area after each incontinent episode. Notify MD if symptoms persist over a week.).   Yes Historical Provider, MD   BP 149/72 mmHg  Pulse 95  Temp(Src) 98.1  F (36.7 C) (Oral)  Resp 20  SpO2 97% Physical Exam  Constitutional: He appears well-developed and well-nourished. No distress.  HENT:  Head: Normocephalic and atraumatic.  Mouth/Throat: No oropharyngeal exudate.  Eyes: Pupils are equal, round, and reactive to light.  Neck: Normal range of motion. Neck supple.  Cardiovascular: Normal rate, regular rhythm and normal heart sounds.  Exam reveals no gallop and no friction rub.   No murmur heard. Pulmonary/Chest: Effort normal and breath sounds normal. No respiratory distress. He has no wheezes. He has no rales.  Abdominal: Soft. Bowel sounds are normal. He exhibits no distension and no mass. There is no tenderness. There is no rebound and no guarding.  Musculoskeletal: Normal range of motion. He exhibits edema (RLE>LLE, but only wearing TED hose on LLE). He exhibits no tenderness.  Neurological: He is alert. No cranial nerve deficit or sensory deficit. He exhibits abnormal muscle tone. Coordination normal. GCS eye subscore is 4. GCS verbal subscore is 4. GCS motor subscore is 6.  4/5 strength BLLE  Skin: Skin is warm and dry.  Psychiatric: He has a normal mood and affect.    ED Course  Procedures (including critical care time) Labs Review Labs Reviewed - No data to display  Imaging Review Dg Thoracic Spine 2 View  01/24/2014   CLINICAL DATA:  80 year old male with history of trauma from a fall complaining of back pain.  EXAM: THORACIC SPINE - 2 VIEW  COMPARISON:  No priors.  FINDINGS: Multilevel degenerative disc disease. There is no evidence of thoracic spine fracture. Alignment is normal. No other significant bone abnormalities are identified.  IMPRESSION: Negative.   Electronically Signed   By: Trudie Reedaniel  Entrikin M.D.   On: 01/24/2014 19:13   Dg Lumbar Spine Complete  01/24/2014   CLINICAL DATA:  Patient status post fall.  Diffuse back pain.  EXAM: LUMBAR SPINE - COMPLETE 4+ VIEW  COMPARISON:  Pelvic radiograph 01/24/2014  FINDINGS:  Multilevel degenerative disc and facet disease involving the visualized lumbar spine. There is grade 1 anterolisthesis of L4 on L5. Preservation of the vertebral body and intervertebral disc space height. SI joints are unremarkable. Calcific density projecting within the right upper quadrant, nonspecific, potentially representing calcified gallstone or porcelain gallbladder.  IMPRESSION: Multilevel degenerative disc and facet disease. No definite evidence for acute lumbar spine fracture.  Grade 1 anterolisthesis L4 on L5, likely secondary to degenerative facet disease.  Porcelain gallbladder versus large gallstone within the right upper quadrant.   Electronically Signed   By: Annia Beltrew  Davis M.D.   On: 01/24/2014 19:21   Dg Pelvis 1-2 Views  01/24/2014   CLINICAL DATA:  80 year old male with history of trauma from a fall complaining of pain all over.  EXAM: PELVIS - 1-2 VIEW  COMPARISON:  No priors.  FINDINGS: Bony pelvic ring is intact. Bilateral proximal femurs as visualized appear intact, and the femoral heads appear located on these AP images. 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 Markers from a mesh repair, likely for right inguinal hernia repair,  are noted.  IMPRESSION: 1. No signs of significant acute traumatic injury to the bony pelvis.   Electronically Signed   By: Trudie Reed M.D.   On: 01/24/2014 19:26   Ct Head Wo Contrast  01/24/2014   CLINICAL DATA:  80 year old male with history of trauma from a fall over backwards with injury to the head on a carpeted floor. No associated loss of consciousness. Head and neck pain.  EXAM: CT HEAD WITHOUT CONTRAST  CT CERVICAL SPINE WITHOUT CONTRAST  TECHNIQUE: Multidetector CT imaging of the head and cervical spine was performed following the standard protocol without intravenous contrast. Multiplanar CT image reconstructions of the cervical spine were also generated.  COMPARISON:  Head CT 01/14/2014.  Cervical  spine CT 02/12/2013.  FINDINGS: CT HEAD FINDINGS  Moderate cerebral and mild cerebellar atrophy. Associated ex vacuo dilatation of the ventricular system. Patchy areas of decreased attenuation are noted throughout the deep and periventricular white matter of the cerebral hemispheres bilaterally, compatible with mild chronic microvascular ischemic disease. No acute displaced skull fractures are identified. No acute intracranial abnormality. Specifically, no evidence of acute post-traumatic intracranial hemorrhage, no definite regions of acute/subacute cerebral ischemia, no focal mass, mass effect, hydrocephalus or abnormal intra or extra-axial fluid collections. The visualized paranasal sinuses and mastoids are well pneumatized.  CT CERVICAL SPINE FINDINGS  No acute displaced fractures of the cervical spine prevertebral soft tissues are normal. Again noted is extensive multilevel degenerative disc disease, most severe at C5-C6 and C6-C7. At C6-C7 there is near complete bony fusion. At C5-C6 there is acute reversal of normal cervical lordosis, which is unchanged and presumably chronic. Alignment is otherwise anatomic. Multilevel facet arthropathy is also noted. Visualized portions of the upper thorax are unremarkable.  IMPRESSION: 1. No signs of significant acute traumatic injury to the skull, brain or cervical spine. 2. Moderate cerebral and mild cerebellar atrophy with mild chronic microvascular ischemic changes in the cerebral white matter redemonstrated, similar to prior examinations, as above. 3. Severe multilevel degenerative disc disease and cervical spondylosis, similar to the prior examination, as above   Electronically Signed   By: Trudie Reed M.D.   On: 01/24/2014 19:01   Ct Cervical Spine Wo Contrast  01/24/2014   CLINICAL DATA:  80 year old male with history of trauma from a fall over backwards with injury to the head on a carpeted floor. No associated loss of consciousness. Head and neck pain.   EXAM: CT HEAD WITHOUT CONTRAST  CT CERVICAL SPINE WITHOUT CONTRAST  TECHNIQUE: Multidetector CT imaging of the head and cervical spine was performed following the standard protocol without intravenous contrast. Multiplanar CT image reconstructions of the cervical spine were also generated.  COMPARISON:  Head CT 01/14/2014.  Cervical spine CT 02/12/2013.  FINDINGS: CT HEAD FINDINGS  Moderate cerebral and mild cerebellar atrophy. Associated ex vacuo dilatation of the ventricular system. Patchy areas of decreased attenuation are noted throughout the deep and periventricular white matter of the cerebral hemispheres bilaterally, compatible with mild chronic microvascular ischemic disease. No acute displaced skull fractures are identified. No acute intracranial abnormality. Specifically, no evidence of acute post-traumatic intracranial hemorrhage, no definite regions of acute/subacute cerebral ischemia, no focal mass, mass effect, hydrocephalus or abnormal intra or extra-axial fluid collections. The visualized paranasal sinuses and mastoids are well pneumatized.  CT CERVICAL SPINE FINDINGS  No acute displaced fractures of the cervical spine prevertebral soft tissues are normal. Again noted is extensive multilevel degenerative disc disease, most severe at C5-C6 and C6-C7. At C6-C7 there  is near complete bony fusion. At C5-C6 there is acute reversal of normal cervical lordosis, which is unchanged and presumably chronic. Alignment is otherwise anatomic. Multilevel facet arthropathy is also noted. Visualized portions of the upper thorax are unremarkable.  IMPRESSION: 1. No signs of significant acute traumatic injury to the skull, brain or cervical spine. 2. Moderate cerebral and mild cerebellar atrophy with mild chronic microvascular ischemic changes in the cerebral white matter redemonstrated, similar to prior examinations, as above. 3. Severe multilevel degenerative disc disease and cervical spondylosis, similar to the  prior examination, as above   Electronically Signed   By: Trudie Reed M.D.   On: 01/24/2014 19:01     EKG Interpretation None      MDM   Final diagnoses:  Back pain  Fall from standing, initial encounter  Closed head injury without loss of consciousness, initial encounter    Pt is a 80 y.o. male with Pmhx as above who presents with mechanical fall backwards while walking in a hall at his memory care unit. No known LOC, at baseline mental status. Pt began complaining of poorly defined back pain. No focal neuro findings on sings of acute trauma on exam and I am unable to reproduce the back pain on PE. No reproducible neck pain.    Ambulated to bathroom with patient with assistance.  He normally uses walker at home.  Family states that gait was usual.  He'll he is safe to discharge back to his facility.  Return precautions given for new or worsening symptoms including numbness, weakness, altered mental status.     Toy Cookey, MD 01/24/14 [phone]

## 2014-01-24 NOTE — ED Notes (Signed)
Bed: [id number] Expected date: 01/24/14 Expected time: 5:05 PM Means of arrival: Ambulance Comments: Fall from [facility]

## 2014-01-24 NOTE — ED Notes (Signed)
[facility] called to take pt back to [facility].  

## 2014-01-24 NOTE — ED Notes (Signed)
Patient transported to X-ray 

## 2014-01-24 NOTE — ED Notes (Signed)
Pt sts that his back started hurting after "I saw my sister." Pt sister is not in facility. Pt has hx of dementia. Pt is in NAD

## 2014-01-24 NOTE — ED Notes (Signed)
Pt from [facility] via EMS-Per EMS, pt was walking in hallway, fall backwards hitting back of head on carpeted floor. Pt did not have LOC and does not take blood thinners. No injuries noted. Pt sent via fall protocol per facility. Pt has hx of dementia. Pt in NAD

## 2014-07-08 ENCOUNTER — Encounter (HOSPITAL_COMMUNITY): Payer: Self-pay

## 2014-07-08 ENCOUNTER — Emergency Department (HOSPITAL_COMMUNITY)
Admission: EM | Admit: 2014-07-08 | Discharge: 2014-07-09 | Disposition: A | Discharge status: Home / Self Care | Payer: Medicare Other | Attending: Emergency Medicine | Admitting: Emergency Medicine

## 2014-07-08 ENCOUNTER — Emergency Department (HOSPITAL_COMMUNITY): Payer: Medicare Other

## 2014-07-08 DIAGNOSIS — Y93E1 Activity, personal bathing and showering: Secondary | ICD-10-CM | POA: Diagnosis not present

## 2014-07-08 DIAGNOSIS — Z87448 Personal history of other diseases of urinary system: Secondary | ICD-10-CM | POA: Insufficient documentation

## 2014-07-08 DIAGNOSIS — Z79899 Other long term (current) drug therapy: Secondary | ICD-10-CM | POA: Diagnosis not present

## 2014-07-08 DIAGNOSIS — S0100XA Unspecified open wound of scalp, initial encounter: Secondary | ICD-10-CM | POA: Insufficient documentation

## 2014-07-08 DIAGNOSIS — Y92121 Bathroom in nursing home as the place of occurrence of the external cause: Secondary | ICD-10-CM | POA: Diagnosis not present

## 2014-07-08 DIAGNOSIS — F0390 Unspecified dementia without behavioral disturbance: Secondary | ICD-10-CM | POA: Diagnosis not present

## 2014-07-08 DIAGNOSIS — W1839XA Other fall on same level, initial encounter: Secondary | ICD-10-CM | POA: Diagnosis not present

## 2014-07-08 DIAGNOSIS — Z8639 Personal history of other endocrine, nutritional and metabolic disease: Secondary | ICD-10-CM | POA: Insufficient documentation

## 2014-07-08 DIAGNOSIS — Y998 Other external cause status: Secondary | ICD-10-CM | POA: Diagnosis not present

## 2014-07-08 DIAGNOSIS — S199XXA Unspecified injury of neck, initial encounter: Secondary | ICD-10-CM | POA: Insufficient documentation

## 2014-07-08 DIAGNOSIS — Z794 Long term (current) use of insulin: Secondary | ICD-10-CM | POA: Insufficient documentation

## 2014-07-08 DIAGNOSIS — S0990XA Unspecified injury of head, initial encounter: Secondary | ICD-10-CM

## 2014-07-08 NOTE — ED Notes (Signed)
Bed: WA21 Expected date: 07/08/14 Expected time: 8:58 PM Means of arrival: Ambulance Comments: 81 yo M  Fall head lac, neck pain

## 2014-07-08 NOTE — ED Notes (Addendum)
Patient arrived via EMS after falling in the shower at [facility] [place] where [place] a resident.  Laceration and bump to posterior head.  Patient complains of neck pain.  C-collar in place.     Patient is extremely confused.  Facility reports this is his normal mental status due to dementia.

## 2014-07-08 NOTE — ED Notes (Signed)
Dr Yelverton at bedside.  

## 2014-07-09 NOTE — ED Provider Notes (Signed)
CSN: [id number]     Arrival date & time 07/08/14  2120 History   First MD Initiated Contact with Patient 07/08/14 2304     Chief Complaint  Patient presents with  . Fall     (Consider location/radiation/quality/duration/timing/severity/associated sxs/prior Treatment) HPI Patient history of dementia and memory care facility. Per staff heard the patient fall in the shower. Patient found with scalp laceration. EMS called. Complaining of neck pain and cervical collar place. Patient is unable to give details of the fall. Level V caveat applies. Last tetanus was a year ago per daughter. Patient is at his baseline mental status per daughter Past Medical History  Diagnosis Date  . Dementia   . BPH (benign prostatic hypertrophy)   . Hyperlipidemia    History reviewed. No pertinent past surgical history. History reviewed. No pertinent family history. History  Substance Use Topics  . Smoking status: Never Smoker   . Smokeless tobacco: Not on file  . Alcohol Use: No    Review of Systems  Respiratory: Negative for shortness of breath.   Cardiovascular: Negative for chest pain.  Gastrointestinal: Negative for nausea, vomiting and abdominal pain.  Musculoskeletal: Positive for neck pain.  Skin: Positive for wound.  Neurological: Negative for weakness, numbness and headaches.  Psychiatric/Behavioral: Positive for confusion.  All other systems reviewed and are negative.     Allergies  Review of patient's allergies indicates no known allergies.  Home Medications   Prior to Admission medications   Medication Sig Start Date End Date Taking? Authorizing Provider  acetaminophen (TYLENOL) 500 MG tablet Take 1,000 mg by mouth 2 (two) times daily.   Yes Historical Provider, MD  donepezil (ARICEPT) 10 MG tablet Take 10 mg by mouth at bedtime.    Yes Historical Provider, MD  furosemide (LASIX) 40 MG tablet Take 40 mg by mouth daily with breakfast.    Yes Historical Provider, MD  insulin aspart  (NOVOLOG) 100 UNIT/ML injection Order Questions    Question Answer Comment   Correction coverage: Resistant (obese, steroids)    CBG < 70: implement hypoglycemia protocol    CBG 70 - 120: 0 units    CBG 121 - 150: 3 units    CBG 151 - 200: 4 units    CBG 201 - 250: 7 units    CBG 251 - 300: 11 units    CBG 301 - 350: 15 units    CBG 351 - 400: 20 units    CBG > 400 call MD and obtain STAT lab verification 01/17/14  Yes Richarda OverlieNayana Abrol, MD  insulin glargine (LANTUS) 100 UNIT/ML injection Inject 0.2 mLs (20 Units total) into the skin daily. 01/17/14  Yes Richarda OverlieNayana Abrol, MD  LORazepam (ATIVAN) 0.5 MG tablet Take 0.5 mg by mouth every 6 (six) hours as needed (for anxiety / agitation).    Yes Historical Provider, MD  potassium chloride SA (K-DUR,KLOR-CON) 20 MEQ tablet Take 20 mEq by mouth every morning.    Yes Historical Provider, MD  ranitidine (ZANTAC) 75 MG tablet Take 150 mg by mouth every morning.    Yes Historical Provider, MD  Skin Protectants, Misc. (BAZA PROTECT EX) Apply 1 application topically as needed (Apply to affected area after each incontinent episode. Notify MD if symptoms persist over a week.).   Yes Historical Provider, MD  traMADol (ULTRAM) 50 MG tablet Take 25 mg by mouth 2 (two) times daily.   Yes Historical Provider, MD   BP 151/69 mmHg  Pulse 65  Temp(Src) 97.5 F (  36.4 C) (Oral)  Resp 18  SpO2 97% Physical Exam  Constitutional: He appears well-developed and well-nourished. No distress.  HENT:  Head: Normocephalic.  Mouth/Throat: Oropharynx is clear and moist. No oropharyngeal exudate.  Irregular appearing skin tear to the posterior scalp roughly 2 cm. No active bleeding. No bony deformity.  Eyes: EOM are normal.  Pinpoint pupils bilaterally  Neck: Normal range of motion. Neck supple.  Patient is in cervical collar.  Cardiovascular: Normal rate and regular rhythm.   Pulmonary/Chest: Effort normal and breath sounds normal. No respiratory  distress. He has no wheezes. He has no rales. He exhibits no tenderness.  Abdominal: Soft. Bowel sounds are normal. He exhibits no distension and no mass. There is no tenderness. There is no rebound and no guarding.  Musculoskeletal: Normal range of motion. He exhibits no edema or tenderness.  Neurological: He is alert.  Oriented to person. 5/5 motor in all extremities. Sensation fully intact.  Skin: Skin is warm and dry. No rash noted. No erythema.  Psychiatric: He has a normal mood and affect. His behavior is normal.  Nursing note and vitals reviewed.   ED Course  Procedures (including critical care time) Labs Review Labs Reviewed - No data to display  Imaging Review Ct Head Wo Contrast  07/09/2014   CLINICAL DATA:  81 year old male post fall in shower. Now with neck pain. Laceration to posterior head.  EXAM: CT HEAD WITHOUT CONTRAST  CT CERVICAL SPINE WITHOUT CONTRAST  TECHNIQUE: Multidetector CT imaging of the head and cervical spine was performed following the standard protocol without intravenous contrast. Multiplanar CT image reconstructions of the cervical spine were also generated.  COMPARISON:  01/24/2014  FINDINGS: CT HEAD FINDINGS  Moderate atrophy and chronic small vessel ischemic change. Remote lacunar infarct in the right basal ganglia. No intracranial hemorrhage, mass effect, or midline shift. No evidence of territorial infarct. No intracranial fluid collection. Posterior scalp hematoma without associated fracture. Scattered sebaceous cysts. Calvarium is intact. Included paranasal sinuses and mastoid air cells are well aerated.  CT CERVICAL SPINE FINDINGS  No acute fracture or subluxation. There is stable multilevel degenerative change throughout the cervical spine. Reversal of normal lordosis at C5-C6, unchanged. Disc space narrowing at C5-C6 and C6-C7 and to a lesser extent C2-C3. Multilevel facet arthropathy. No jumped or perched facets. There is a bone island in C7. No  prevertebral soft tissue edema.  IMPRESSION: 1. No acute intracranial abnormality. Stable atrophy and chronic small vessel ischemic change. 2. Stable degenerative change in the cervical spine without acute fracture or subluxation.   Electronically Signed   By: Rubye Oaks M.D.   On: 07/09/2014 00:36   Ct Cervical Spine Wo Contrast  07/09/2014   CLINICAL DATA:  81 year old male post fall in shower. Now with neck pain. Laceration to posterior head.  EXAM: CT HEAD WITHOUT CONTRAST  CT CERVICAL SPINE WITHOUT CONTRAST  TECHNIQUE: Multidetector CT imaging of the head and cervical spine was performed following the standard protocol without intravenous contrast. Multiplanar CT image reconstructions of the cervical spine were also generated.  COMPARISON:  01/24/2014  FINDINGS: CT HEAD FINDINGS  Moderate atrophy and chronic small vessel ischemic change. Remote lacunar infarct in the right basal ganglia. No intracranial hemorrhage, mass effect, or midline shift. No evidence of territorial infarct. No intracranial fluid collection. Posterior scalp hematoma without associated fracture. Scattered sebaceous cysts. Calvarium is intact. Included paranasal sinuses and mastoid air cells are well aerated.  CT CERVICAL SPINE FINDINGS  No acute fracture or  subluxation. There is stable multilevel degenerative change throughout the cervical spine. Reversal of normal lordosis at C5-C6, unchanged. Disc space narrowing at C5-C6 and C6-C7 and to a lesser extent C2-C3. Multilevel facet arthropathy. No jumped or perched facets. There is a bone island in C7. No prevertebral soft tissue edema.  IMPRESSION: 1. No acute intracranial abnormality. Stable atrophy and chronic small vessel ischemic change. 2. Stable degenerative change in the cervical spine without acute fracture or subluxation.   Electronically Signed   By: Rubye OaksMelanie  Ehinger M.D.   On: 07/09/2014 00:36     EKG Interpretation None      MDM   Final diagnoses:  Closed  head injury, initial encounter   Patient remains at his mental baseline. CT is without any acute findings. Will discharge back to [facility]. Head injury precautions given.    Loren Raceravid Exie Chrismer, MD 07/09/14 [phone]

## 2014-07-09 NOTE — Discharge Instructions (Signed)

## 2014-07-09 NOTE — ED Notes (Signed)
Communications called for transport.  

## 2014-07-09 NOTE — ED Notes (Signed)
Patient's daughter, Stark Jocknn Brady: [phone].  Call if patient is not being sent back to facility.

## 2014-11-02 ENCOUNTER — Emergency Department (HOSPITAL_COMMUNITY): Payer: Medicare Other

## 2014-11-02 ENCOUNTER — Encounter (HOSPITAL_COMMUNITY): Payer: Self-pay | Admitting: *Deleted

## 2014-11-02 ENCOUNTER — Emergency Department (HOSPITAL_COMMUNITY)
Admission: EM | Admit: 2014-11-02 | Discharge: 2014-11-02 | Disposition: A | Discharge status: Skilled Nursing Facility | Payer: Medicare Other | Attending: Emergency Medicine | Admitting: Emergency Medicine

## 2014-11-02 DIAGNOSIS — Z794 Long term (current) use of insulin: Secondary | ICD-10-CM | POA: Diagnosis not present

## 2014-11-02 DIAGNOSIS — Z87438 Personal history of other diseases of male genital organs: Secondary | ICD-10-CM | POA: Insufficient documentation

## 2014-11-02 DIAGNOSIS — Z79899 Other long term (current) drug therapy: Secondary | ICD-10-CM | POA: Diagnosis not present

## 2014-11-02 DIAGNOSIS — Z792 Long term (current) use of antibiotics: Secondary | ICD-10-CM | POA: Insufficient documentation

## 2014-11-02 DIAGNOSIS — R4182 Altered mental status, unspecified: Secondary | ICD-10-CM

## 2014-11-02 DIAGNOSIS — R5383 Other fatigue: Secondary | ICD-10-CM | POA: Diagnosis present

## 2014-11-02 DIAGNOSIS — Z8639 Personal history of other endocrine, nutritional and metabolic disease: Secondary | ICD-10-CM | POA: Diagnosis not present

## 2014-11-02 DIAGNOSIS — R531 Weakness: Secondary | ICD-10-CM

## 2014-11-02 LAB — CBC WITH DIFFERENTIAL/PLATELET
BASOS ABS: 0 10*3/uL (ref 0.0–0.1)
BASOS PCT: 0 % (ref 0–1)
EOS PCT: 4 % (ref 0–5)
Eosinophils Absolute: 0.4 10*3/uL (ref 0.0–0.7)
HCT: 44.7 % (ref 39.0–52.0)
Hemoglobin: 14.4 g/dL (ref 13.0–17.0)
LYMPHS PCT: 17 % (ref 12–46)
Lymphs Abs: 1.5 10*3/uL (ref 0.7–4.0)
MCH: 29.4 pg (ref 26.0–34.0)
MCHC: 32.2 g/dL (ref 30.0–36.0)
MCV: 91.2 fL (ref 78.0–100.0)
MONO ABS: 1 10*3/uL (ref 0.1–1.0)
Monocytes Relative: 11 % (ref 3–12)
NEUTROS ABS: 5.8 10*3/uL (ref 1.7–7.7)
Neutrophils Relative %: 68 % (ref 43–77)
PLATELETS: 260 10*3/uL (ref 150–400)
RBC: 4.9 MIL/uL (ref 4.22–5.81)
RDW: 13.6 % (ref 11.5–15.5)
WBC: 8.8 10*3/uL (ref 4.0–10.5)

## 2014-11-02 LAB — BASIC METABOLIC PANEL
ANION GAP: 6 (ref 5–15)
BUN: 21 mg/dL — ABNORMAL HIGH (ref 6–20)
CALCIUM: 9.1 mg/dL (ref 8.9–10.3)
CO2: 30 mmol/L (ref 22–32)
Chloride: 103 mmol/L (ref 101–111)
Creatinine, Ser: 1.23 mg/dL (ref 0.61–1.24)
GFR, EST AFRICAN AMERICAN: 59 mL/min — AB (ref >60)
GFR, EST NON AFRICAN AMERICAN: 51 mL/min — AB (ref >60)
GLUCOSE: 139 mg/dL — AB (ref 65–99)
POTASSIUM: 4.3 mmol/L (ref 3.5–5.1)
SODIUM: 139 mmol/L (ref 135–145)

## 2014-11-02 LAB — URINALYSIS, ROUTINE W REFLEX MICROSCOPIC
BILIRUBIN URINE: NEGATIVE
Glucose, UA: NEGATIVE mg/dL
HGB URINE DIPSTICK: NEGATIVE
KETONES UR: NEGATIVE mg/dL
Leukocytes, UA: NEGATIVE
NITRITE: NEGATIVE
PROTEIN: NEGATIVE mg/dL
SPECIFIC GRAVITY, URINE: 1.01 (ref 1.005–1.030)
UROBILINOGEN UA: 1 mg/dL (ref 0.0–1.0)
pH: 5.5 (ref 5.0–8.0)

## 2014-11-02 LAB — TROPONIN I

## 2014-11-02 NOTE — ED Notes (Signed)
Patient is from [facility] assisted living (memory care unit). Facility representative contacted EMS as a result of pt appearing to be lethargic today. Pt is at baseline behavior as he has a hx dementia is A&O to self. Pt reports he is just tired today.

## 2014-11-02 NOTE — Discharge Instructions (Signed)

## 2014-11-02 NOTE — ED Provider Notes (Signed)
CSN: [id number]     Arrival date & time 11/02/14  1235 History   First MD Initiated Contact with Patient 11/02/14 1308     Chief Complaint  Patient presents with  . Fatigue     (Consider location/radiation/quality/duration/timing/severity/associated sxs/prior Treatment) HPI.... Level V caveat for dementia. Patient resides in a memory care unit of [facility].  Daughter reports change from his baseline behavior. He is more fatigued and lethargic. He has been unsteady in his gait. Daughter wonders if he might have a urinary tract infection.  DNR  Past Medical History  Diagnosis Date  . Dementia   . BPH (benign prostatic hypertrophy)   . Hyperlipidemia    History reviewed. No pertinent past surgical history. No family history on file. Social History  Substance Use Topics  . Smoking status: Never Smoker   . Smokeless tobacco: None  . Alcohol Use: No    Review of Systems  Unable to perform ROS: Dementia      Allergies  Review of patient's allergies indicates no known allergies.  Home Medications   Prior to Admission medications   Medication Sig Start Date End Date Taking? Authorizing Provider  acetaminophen (TYLENOL) 500 MG tablet Take 1,000 mg by mouth 2 (two) times daily.   Yes Historical Provider, MD  cephALEXin (KEFLEX) 250 MG capsule Take 250 mg by mouth 3 (three) times daily. 10/30/14 11/04/14 Yes Historical Provider, MD  cetirizine (ZYRTEC) 10 MG tablet Take 10 mg by mouth daily.   Yes Historical Provider, MD  donepezil (ARICEPT) 10 MG tablet Take 10 mg by mouth at bedtime.    Yes Historical Provider, MD  furosemide (LASIX) 40 MG tablet Take 20 mg by mouth daily with breakfast.    Yes Historical Provider, MD  insulin glargine (LANTUS) 100 UNIT/ML injection Inject 0.2 mLs (20 Units total) into the skin daily. Patient taking differently: Inject 15 Units into the skin daily.  01/17/14  Yes Richarda Overlie, MD  Lactase (LACTAID PO) Take 1 tablet by mouth 3 (three) times daily.    Yes Historical Provider, MD  loperamide (IMODIUM A-D) 2 MG tablet Take 2 mg by mouth every morning.   Yes Historical Provider, MD  mupirocin ointment (BACTROBAN) 2 % Place 1 application into the nose 2 (two) times daily. Affected area of head   Yes Historical Provider, MD  potassium chloride SA (K-DUR,KLOR-CON) 20 MEQ tablet Take 20 mEq by mouth every morning.    Yes Historical Provider, MD  ranitidine (ZANTAC) 75 MG tablet Take 150 mg by mouth every morning.    Yes Historical Provider, MD  traMADol (ULTRAM) 50 MG tablet Take 25 mg by mouth 2 (two) times daily.   Yes Historical Provider, MD  insulin aspart (NOVOLOG) 100 UNIT/ML injection Order Questions    Question Answer Comment   Correction coverage: Resistant (obese, steroids)    CBG < 70: implement hypoglycemia protocol    CBG 70 - 120: 0 units    CBG 121 - 150: 3 units    CBG 151 - 200: 4 units    CBG 201 - 250: 7 units    CBG 251 - 300: 11 units    CBG 301 - 350: 15 units    CBG 351 - 400: 20 units    CBG > 400 call MD and obtain STAT lab verification Patient not taking: Reported on 11/02/2014 01/17/14   Richarda Overlie, MD   BP 136/77 mmHg  Pulse 69  Temp(Src) 97.8 F (36.6 C) (Oral)  Resp  18  SpO2 98% Physical Exam  Constitutional:  Pleasant, demented  HENT:  Head: Normocephalic and atraumatic.  Eyes: Conjunctivae and EOM are normal. Pupils are equal, round, and reactive to light.  Neck: Normal range of motion. Neck supple.  Cardiovascular: Normal rate and regular rhythm.   Pulmonary/Chest: Effort normal and breath sounds normal.  Abdominal: Soft. Bowel sounds are normal.  Musculoskeletal: Normal range of motion.  Neurological:  Appears to be moving all his extremities  Skin: Skin is warm and dry.  Psychiatric:  Flat affect  Nursing note and vitals reviewed.   ED Course  Procedures (including critical care time) Labs Review Labs Reviewed  BASIC METABOLIC PANEL - Abnormal; Notable for the  following:    Glucose, Bld 139 (*)    BUN 21 (*)    GFR calc non Af Amer 51 (*)    GFR calc Af Amer 59 (*)    All other components within normal limits  CBC WITH DIFFERENTIAL/PLATELET  TROPONIN I  URINALYSIS, ROUTINE W REFLEX MICROSCOPIC (NOT AT [facility])    Imaging Review Dg Chest 1 View  11/02/2014   CLINICAL DATA:  Chest pain and inability to walk for 3 days.  EXAM: CHEST  1 VIEW  COMPARISON:  December 31, 2013  FINDINGS: The heart size and mediastinal contours are stable. The heart size is enlarged. The lung volumes are low. There is chronic elevation of right hemidiaphragm. There is mild increased central pulmonary markings. The visualized skeletal structures are stable.  IMPRESSION: Mild central pulmonary vascular congestion.  Chronic elevated right hemidiaphragm.  Cardiomegaly.   Electronically Signed   By: Sherian Rein M.D.   On: 11/02/2014 14:55   Ct Head Wo Contrast  11/02/2014   CLINICAL DATA:  Patient is from [facility] assisted living (memory care unit). Facility representative contacted EMS as a result of pt appearing to be lethargic today. Pt is at baseline behavior as he has a hx dementia is A&O to self. Pt reports he is just tired today. Pt has dementia  EXAM: CT HEAD WITHOUT CONTRAST  TECHNIQUE: Contiguous axial images were obtained from the base of the skull through the vertex without intravenous contrast.  COMPARISON:  07/08/2014  FINDINGS: Ventricles are normal in configuration. There is ventricular and sulcal enlargement reflecting moderate diffuse atrophy.  There are no parenchymal masses or mass effect. There is no evidence of a cortical infarct. Mild white matter hypoattenuation is noted consistent with chronic microvascular ischemic change, stable.  There are no extra-axial masses or abnormal fluid collections.  There is no intracranial hemorrhage.  Visualized sinuses and mastoid air cells are clear.  IMPRESSION: 1. No acute intracranial abnormalities. 2. Atrophy and mild  chronic microvascular ischemic change, stable from the prior head CT.   Electronically Signed   By: Amie Portland M.D.   On: 11/02/2014 14:29   I have personally reviewed and evaluated these images and lab results as part of my medical decision-making.   EKG Interpretation   Date/Time:  Monday November 02 2014 15:43:49 EDT Ventricular Rate:  82 PR Interval:  156 QRS Duration: 94 QT Interval:  370 QTC Calculation: 432 R Axis:   36 Text Interpretation:  Sinus rhythm with frequent Premature ventricular  complexes Moderate voltage criteria for LVH, may be normal variant  Inferior infarct , age undetermined Possible Anterior infarct , age  undetermined Abnormal ECG Confirmed by Bijan Ridgley  MD, Josselin Gaulin ([id number]) on  11/02/2014 3:50:58 PM      MDM   Final  diagnoses:  Altered mental status, unspecified altered mental status type   Basic labs within normal limits. CT head and chest x-ray showed no acute changes. Urinalysis pending.  Disc c Dr Freida Busman and daughter    Donnetta Hutching, MD 11/02/14 [phone]

## 2014-11-02 NOTE — ED Provider Notes (Signed)
Patient's UA is negative he'll be discharged home  Lorre Nick, MD 11/02/14 1709

## 2014-11-02 NOTE — ED Notes (Signed)
Patient transported to CT 

## 2014-11-02 NOTE — ED Notes (Signed)
Questions denied. Instruction reviewed by patients daughter

## 2014-11-02 NOTE — ED Notes (Signed)
Bed: WHALB Expected date:  Expected time:  Means of arrival:  Comments: 

## 2015-05-08 ENCOUNTER — Encounter (HOSPITAL_COMMUNITY): Payer: Self-pay | Admitting: Emergency Medicine

## 2015-05-08 ENCOUNTER — Emergency Department (HOSPITAL_COMMUNITY)
Admission: EM | Admit: 2015-05-08 | Discharge: 2015-05-08 | Disposition: A | Discharge status: Home / Self Care | Payer: Medicare Other | Attending: Emergency Medicine | Admitting: Emergency Medicine

## 2015-05-08 ENCOUNTER — Emergency Department (HOSPITAL_COMMUNITY): Payer: Medicare Other

## 2015-05-08 DIAGNOSIS — F0390 Unspecified dementia without behavioral disturbance: Secondary | ICD-10-CM | POA: Insufficient documentation

## 2015-05-08 DIAGNOSIS — S0990XA Unspecified injury of head, initial encounter: Secondary | ICD-10-CM | POA: Diagnosis present

## 2015-05-08 DIAGNOSIS — Y998 Other external cause status: Secondary | ICD-10-CM | POA: Insufficient documentation

## 2015-05-08 DIAGNOSIS — Z79899 Other long term (current) drug therapy: Secondary | ICD-10-CM | POA: Diagnosis not present

## 2015-05-08 DIAGNOSIS — Y92129 Unspecified place in nursing home as the place of occurrence of the external cause: Secondary | ICD-10-CM | POA: Diagnosis not present

## 2015-05-08 DIAGNOSIS — Z87438 Personal history of other diseases of male genital organs: Secondary | ICD-10-CM | POA: Insufficient documentation

## 2015-05-08 DIAGNOSIS — Y9389 Activity, other specified: Secondary | ICD-10-CM | POA: Insufficient documentation

## 2015-05-08 DIAGNOSIS — Z8639 Personal history of other endocrine, nutritional and metabolic disease: Secondary | ICD-10-CM | POA: Diagnosis not present

## 2015-05-08 DIAGNOSIS — S199XXA Unspecified injury of neck, initial encounter: Secondary | ICD-10-CM | POA: Insufficient documentation

## 2015-05-08 DIAGNOSIS — Z792 Long term (current) use of antibiotics: Secondary | ICD-10-CM | POA: Insufficient documentation

## 2015-05-08 DIAGNOSIS — Z794 Long term (current) use of insulin: Secondary | ICD-10-CM | POA: Diagnosis not present

## 2015-05-08 DIAGNOSIS — S3992XA Unspecified injury of lower back, initial encounter: Secondary | ICD-10-CM | POA: Insufficient documentation

## 2015-05-08 DIAGNOSIS — W19XXXA Unspecified fall, initial encounter: Secondary | ICD-10-CM

## 2015-05-08 DIAGNOSIS — W1839XA Other fall on same level, initial encounter: Secondary | ICD-10-CM | POA: Insufficient documentation

## 2015-05-08 MED ORDER — TETANUS-DIPHTH-ACELL PERTUSSIS 5-2.5-18.5 LF-MCG/0.5 IM SUSP: 0.5000 mL | Freq: Once | INTRAMUSCULAR | Status: DC

## 2015-05-08 NOTE — ED Notes (Signed)
Bed: [id number] Expected date: 05/08/15 Expected time: 9:12 AM Means of arrival: Ambulance Comments: Fall from [facility] home

## 2015-05-08 NOTE — ED Notes (Signed)
Per EMS pt from [facility] with witnessed fall from walker to chair related to stumbling over feet resulting in laceration to posterior head. Pt hx of dementia oriented per normal.

## 2015-05-08 NOTE — Discharge Instructions (Signed)
Fall Prevention in the Home  Brett Mckee, Your CT scan and xrays today do not show any injuries from you fall.  Take tylenol as needed for your pain.  See a primary care physician within 3 days for close follow up of your fall.  If any symptoms worsen, come back to the ED immediately. Thank you. Falls can cause injuries. They can happen to people of all ages. There are many things you can do to make your home safe and to help prevent falls.  WHAT CAN I DO ON THE OUTSIDE OF MY HOME?  Regularly fix the edges of walkways and driveways and fix any cracks.  Remove anything that might make you trip as you walk through a door, such as a raised step or threshold.  Trim any bushes or trees on the path to your home.  Use bright outdoor lighting.  Clear any walking paths of anything that might make someone trip, such as rocks or tools.  Regularly check to see if handrails are loose or broken. Make sure that both sides of any steps have handrails.  Any raised decks and porches should have guardrails on the edges.  Have any leaves, snow, or ice cleared regularly.  Use sand or salt on walking paths during winter.  Clean up any spills in your garage right away. This includes oil or grease spills. WHAT CAN I DO IN THE BATHROOM?   Use night lights.  Install grab bars by the toilet and in the tub and shower. Do not use towel bars as grab bars.  Use non-skid mats or decals in the tub or shower.  If you need to sit down in the shower, use a plastic, non-slip stool.  Keep the floor dry. Clean up any water that spills on the floor as soon as it happens.  Remove soap buildup in the tub or shower regularly.  Attach bath mats securely with double-sided non-slip rug tape.  Do not have throw rugs and other things on the floor that can make you trip. WHAT CAN I DO IN THE BEDROOM?  Use night lights.  Make sure that you have a light by your bed that is easy to reach.  Do not use any sheets or  blankets that are too big for your bed. They should not hang down onto the floor.  Have a firm chair that has side arms. You can use this for support while you get dressed.  Do not have throw rugs and other things on the floor that can make you trip. WHAT CAN I DO IN THE KITCHEN?  Clean up any spills right away.  Avoid walking on wet floors.  Keep items that you use a lot in easy-to-reach places.  If you need to reach something above you, use a strong step stool that has a grab bar.  Keep electrical cords out of the way.  Do not use floor polish or wax that makes floors slippery. If you must use wax, use non-skid floor wax.  Do not have throw rugs and other things on the floor that can make you trip. WHAT CAN I DO WITH MY STAIRS?  Do not leave any items on the stairs.  Make sure that there are handrails on both sides of the stairs and use them. Fix handrails that are broken or loose. Make sure that handrails are as long as the stairways.  Check any carpeting to make sure that it is firmly attached to the stairs. Fix any carpet  that is loose or worn.  Avoid having throw rugs at the top or bottom of the stairs. If you do have throw rugs, attach them to the floor with carpet tape.  Make sure that you have a light switch at the top of the stairs and the bottom of the stairs. If you do not have them, ask someone to add them for you. WHAT ELSE CAN I DO TO HELP PREVENT FALLS?  Wear shoes that:  Do not have high heels.  Have rubber bottoms.  Are comfortable and fit you well.  Are closed at the toe. Do not wear sandals.  If you use a stepladder:  Make sure that it is fully opened. Do not climb a closed stepladder.  Make sure that both sides of the stepladder are locked into place.  Ask someone to hold it for you, if possible.  Clearly mark and make sure that you can see:  Any grab bars or handrails.  First and last steps.  Where the edge of each step is.  Use tools  that help you move around (mobility aids) if they are needed. These include:  Canes.  Walkers.  Scooters.  Crutches.  Turn on the lights when you go into a dark area. Replace any light bulbs as soon as they burn out.  Set up your furniture so you have a clear path. Avoid moving your furniture around.  If any of your floors are uneven, fix them.  If there are any pets around you, be aware of where they are.  Review your medicines with your doctor. Some medicines can make you feel dizzy. This can increase your chance of falling. Ask your doctor what other things that you can do to help prevent falls.   This information is not intended to replace advice given to you by your health care provider. Make sure you discuss any questions you have with your health care provider.   Document Released: 12/03/2008 Document Revised: 06/23/2014 Document Reviewed: 03/13/2014 Elsevier Interactive Patient Education [employer]2016 [employer] Inc[employer]

## 2015-05-08 NOTE — ED Provider Notes (Signed)
CSN: [id number]     Arrival date & time 05/08/15  0915 History   First MD Initiated Contact with Patient 05/08/15 0920     Chief Complaint  Patient presents with  . Fall   Level 5 Caveat: Dementia  (Consider location/radiation/quality/duration/timing/severity/associated sxs/prior Treatment) HPI  Brett Mckee is a 82 y.o. male with past medical history of dementia, hyperlipidemia, presenting today for a fall. History is obtained by EMS as patient has history of dementia. EMS states patient was going from his walker to the chair when he stumbled over and fell. He has laceration to his posterior head. Patient is oriented as his normal self. There is no loss of consciousness. He describes pain in the back of his head and lower back. He denies feeling ill recently. There are no further complaints.   Past Medical History  Diagnosis Date  . Dementia   . BPH (benign prostatic hypertrophy)   . Hyperlipidemia    History reviewed. No pertinent past surgical history. No family history on file. Social History  Substance Use Topics  . Smoking status: Never Smoker   . Smokeless tobacco: None  . Alcohol Use: No    Review of Systems  Unable to perform ROS: Dementia      Allergies  Review of patient's allergies indicates no known allergies.  Home Medications   Prior to Admission medications   Medication Sig Start Date End Date Taking? Authorizing Provider  acetaminophen (TYLENOL) 500 MG tablet Take 1,000 mg by mouth 2 (two) times daily.    Historical Provider, MD  cetirizine (ZYRTEC) 10 MG tablet Take 10 mg by mouth daily.    Historical Provider, MD  donepezil (ARICEPT) 10 MG tablet Take 10 mg by mouth at bedtime.     Historical Provider, MD  furosemide (LASIX) 40 MG tablet Take 20 mg by mouth daily with breakfast.     Historical Provider, MD  insulin aspart (NOVOLOG) 100 UNIT/ML injection Order Questions    Question Answer Comment   Correction coverage: Resistant (obese,  steroids)    CBG < 70: implement hypoglycemia protocol    CBG 70 - 120: 0 units    CBG 121 - 150: 3 units    CBG 151 - 200: 4 units    CBG 201 - 250: 7 units    CBG 251 - 300: 11 units    CBG 301 - 350: 15 units    CBG 351 - 400: 20 units    CBG > 400 call MD and obtain STAT lab verification Patient not taking: Reported on 11/02/2014 01/17/14   Richarda OverlieNayana Abrol, MD  insulin glargine (LANTUS) 100 UNIT/ML injection Inject 0.2 mLs (20 Units total) into the skin daily. Patient taking differently: Inject 15 Units into the skin daily.  01/17/14   Richarda OverlieNayana Abrol, MD  Lactase (LACTAID PO) Take 1 tablet by mouth 3 (three) times daily.    Historical Provider, MD  loperamide (IMODIUM A-D) 2 MG tablet Take 2 mg by mouth every morning.    Historical Provider, MD  mupirocin ointment (BACTROBAN) 2 % Place 1 application into the nose 2 (two) times daily. Affected area of head    Historical Provider, MD  potassium chloride SA (K-DUR,KLOR-CON) 20 MEQ tablet Take 20 mEq by mouth every morning.     Historical Provider, MD  ranitidine (ZANTAC) 75 MG tablet Take 150 mg by mouth every morning.     Historical Provider, MD  traMADol (ULTRAM) 50 MG tablet Take 25 mg by mouth 2 (  two) times daily.    Historical Provider, MD   BP 125/92 mmHg  Pulse 73  Temp(Src) 97.3 F (36.3 C) (Oral)  Resp 16  SpO2 100% Physical Exam  Constitutional: Vital signs are normal. He appears well-developed and well-nourished.  Non-toxic appearance. He does not appear ill. No distress.  HENT:  Head: Normocephalic and atraumatic.  Nose: Nose normal.  Mouth/Throat: Oropharynx is clear and moist. No oropharyngeal exudate.  Eyes: Conjunctivae and EOM are normal. Pupils are equal, round, and reactive to light. No scleral icterus.  Neck: Normal range of motion. Neck supple. No tracheal deviation, no edema, no erythema and normal range of motion present. No thyroid mass and no thyromegaly present.  C-collar in place. Tenderness  to palpation of the C-spine and lumbar spine.  Cardiovascular: Normal rate, regular rhythm, S1 normal, S2 normal, normal heart sounds, intact distal pulses and normal pulses.  Exam reveals no gallop and no friction rub.   No murmur heard. Pulmonary/Chest: Effort normal and breath sounds normal. No respiratory distress. He has no wheezes. He has no rhonchi. He has no rales.  Abdominal: Soft. Normal appearance and bowel sounds are normal. He exhibits no distension, no ascites and no mass. There is no hepatosplenomegaly. There is no tenderness. There is no rebound, no guarding and no CVA tenderness.  Musculoskeletal: Normal range of motion. He exhibits edema. He exhibits no tenderness.  Lymphadenopathy:    He has no cervical adenopathy.  Neurological: He is alert. He has normal strength. No cranial nerve deficit or sensory deficit. He exhibits normal muscle tone.  Normal strength and sensation in all extremity. Normal cerebellar testing.  Skin: Skin is warm, dry and intact. No petechiae and no rash noted. He is not diaphoretic. No erythema. No pallor.  Psychiatric: He has a normal mood and affect. His behavior is normal. Judgment normal.  Nursing note and vitals reviewed.   ED Course  Procedures (including critical care time) Labs Review Labs Reviewed - No data to display  Imaging Review Dg Lumbar Spine Complete  05/08/2015  CLINICAL DATA:  Pain following fall EXAM: LUMBAR SPINE - COMPLETE 4+ VIEW COMPARISON:  January 24, 2014 FINDINGS: Frontal, lateral, spot lumbosacral lateral common bile oblique views were obtained. There are 5 non-rib-bearing lumbar type vertebral bodies. There is no fracture. There is 4 mm of anterolisthesis of L4 on L5, felt to be due to underlying spondylosis. There is no new spondylolisthesis. There is marked disc space narrowing at L5-S1, stable. Other disc spaces appear unremarkable and stable. There is facet osteoarthritic change at L3-4, L4-5, and L5-S1 bilaterally.  There is a calcified structure in the right upper quadrant region ; question porcelain gallbladder. IMPRESSION: Osteoarthritic change, stable, most marked at L5-S1. Stable mild spondylolisthesis at L4-5 is felt to be due to underlying spondylosis. No fracture evident. Persistent calcified focus in the right upper quadrant. Question porcelain gallbladder. This finding may warrant right upper quadrant ultrasound to further assess. Electronically Signed   By: Bretta Bang III M.D.   On: 05/08/2015 10:33   Ct Head Wo Contrast  05/08/2015  CLINICAL DATA:  Witnessed fall.  Laceration to the posterior head. EXAM: CT HEAD WITHOUT CONTRAST CT CERVICAL SPINE WITHOUT CONTRAST TECHNIQUE: Multidetector CT imaging of the head and cervical spine was performed following the standard protocol without intravenous contrast. Multiplanar CT image reconstructions of the cervical spine were also generated. COMPARISON:  11/02/2014 and 07/08/2014. FINDINGS: CT HEAD FINDINGS Stable atrophy and stable prominent ventricles. In particular, the right  temporal horn is prominent but this is similar to the previous examination. No evidence for acute hemorrhage, mass lesion, midline shift, hydrocephalus or large infarct. Paranasal sinuses are clear. No calvarial fracture. Mild soft tissue swelling along the upper posterior scalp. No underlying fracture. CT CERVICAL SPINE FINDINGS Multilevel degenerative changes in the cervical spine. Negative for an acute fracture. Lung apices are clear without a pneumothorax. No significant soft tissue swelling in the neck. Again noted is kyphosis at C5-C6 with severe disc space narrowing at C6-C7. Normal alignment at the cervical-thoracic junction. IMPRESSION: No acute intracranial abnormality. Stable atrophy. No acute abnormality in the cervical spine. Severe degenerative changes in the cervical spine from C5 through C7. Electronically Signed   By: Richarda Overlie M.D.   On: 05/08/2015 10:34   Ct Cervical  Spine Wo Contrast  05/08/2015  CLINICAL DATA:  Witnessed fall.  Laceration to the posterior head. EXAM: CT HEAD WITHOUT CONTRAST CT CERVICAL SPINE WITHOUT CONTRAST TECHNIQUE: Multidetector CT imaging of the head and cervical spine was performed following the standard protocol without intravenous contrast. Multiplanar CT image reconstructions of the cervical spine were also generated. COMPARISON:  11/02/2014 and 07/08/2014. FINDINGS: CT HEAD FINDINGS Stable atrophy and stable prominent ventricles. In particular, the right temporal horn is prominent but this is similar to the previous examination. No evidence for acute hemorrhage, mass lesion, midline shift, hydrocephalus or large infarct. Paranasal sinuses are clear. No calvarial fracture. Mild soft tissue swelling along the upper posterior scalp. No underlying fracture. CT CERVICAL SPINE FINDINGS Multilevel degenerative changes in the cervical spine. Negative for an acute fracture. Lung apices are clear without a pneumothorax. No significant soft tissue swelling in the neck. Again noted is kyphosis at C5-C6 with severe disc space narrowing at C6-C7. Normal alignment at the cervical-thoracic junction. IMPRESSION: No acute intracranial abnormality. Stable atrophy. No acute abnormality in the cervical spine. Severe degenerative changes in the cervical spine from C5 through C7. Electronically Signed   By: Richarda Overlie M.D.   On: 05/08/2015 10:34   I have personally reviewed and evaluated these images and lab results as part of my medical decision-making.   EKG Interpretation   Date/Time:  Saturday May 08 2015 09:25:08 EDT Ventricular Rate:  69 PR Interval:  158 QRS Duration: 112 QT Interval:  382 QTC Calculation: 409 R Axis:   58 Text Interpretation:  Sinus rhythm Incomplete right bundle branch block  PVC have now resolved Confirmed by Erroll Luna [phone]) on  05/08/2015 9:34:40 AM      MDM   Final diagnoses:  None  Patient presents to  the emergency department for fall at nursing home. He has a small superficial abrasion to his posterior scalp. It is not deep enough to warrant any kind of wound care.  Will obtain CT scan and x-rays for evaluation. Tetanus shot was updated.  11:07 AM There are no prodromal symptoms to the fall, no history concerning for anything other than a purely mechanical fall.  EKG does not show any cause for syncope.  CT and xrays neg for acute injury.  Lumbar xray shows possible porcelain gallbladder however this calcification was also present in 2015.  Upon repeat evaluation, patient has no TTP of the RUQ.  I do not believe US imaging is warranted.  Family told of results and need to follow up outpatient. He appears well and in NAD.  VS remain within his normal limits and he is safe for DC.   Tomasita Crumble, MD 05/08/15 1108

## 2015-05-20 ENCOUNTER — Emergency Department (HOSPITAL_COMMUNITY): Payer: Medicare Other

## 2015-05-20 ENCOUNTER — Encounter (HOSPITAL_COMMUNITY): Payer: Self-pay | Admitting: Emergency Medicine

## 2015-05-20 ENCOUNTER — Emergency Department (HOSPITAL_COMMUNITY)
Admission: EM | Admit: 2015-05-20 | Discharge: 2015-05-20 | Disposition: A | Discharge status: Home / Self Care | Payer: Medicare Other | Attending: Emergency Medicine | Admitting: Emergency Medicine

## 2015-05-20 DIAGNOSIS — T404X5A Adverse effect of other synthetic narcotics, initial encounter: Secondary | ICD-10-CM | POA: Diagnosis not present

## 2015-05-20 DIAGNOSIS — I10 Essential (primary) hypertension: Secondary | ICD-10-CM | POA: Insufficient documentation

## 2015-05-20 DIAGNOSIS — E119 Type 2 diabetes mellitus without complications: Secondary | ICD-10-CM | POA: Insufficient documentation

## 2015-05-20 DIAGNOSIS — F0390 Unspecified dementia without behavioral disturbance: Secondary | ICD-10-CM | POA: Diagnosis not present

## 2015-05-20 DIAGNOSIS — R55 Syncope and collapse: Secondary | ICD-10-CM | POA: Insufficient documentation

## 2015-05-20 DIAGNOSIS — T50905A Adverse effect of unspecified drugs, medicaments and biological substances, initial encounter: Secondary | ICD-10-CM

## 2015-05-20 DIAGNOSIS — Z794 Long term (current) use of insulin: Secondary | ICD-10-CM | POA: Insufficient documentation

## 2015-05-20 DIAGNOSIS — Z79899 Other long term (current) drug therapy: Secondary | ICD-10-CM | POA: Insufficient documentation

## 2015-05-20 DIAGNOSIS — Z87438 Personal history of other diseases of male genital organs: Secondary | ICD-10-CM | POA: Diagnosis not present

## 2015-05-20 HISTORY — DX: Essential (primary) hypertension: I10

## 2015-05-20 HISTORY — DX: Type 2 diabetes mellitus without complications: E11.9

## 2015-05-20 LAB — DIFFERENTIAL
BASOS PCT: 0 %
Basophils Absolute: 0 10*3/uL (ref 0.0–0.1)
EOS PCT: 3 %
Eosinophils Absolute: 0.2 10*3/uL (ref 0.0–0.7)
LYMPHS PCT: 18 %
Lymphs Abs: 1.2 10*3/uL (ref 0.7–4.0)
MONO ABS: 0.5 10*3/uL (ref 0.1–1.0)
Monocytes Relative: 7 %
NEUTROS PCT: 72 %
Neutro Abs: 4.9 10*3/uL (ref 1.7–7.7)

## 2015-05-20 LAB — URINALYSIS, ROUTINE W REFLEX MICROSCOPIC
BILIRUBIN URINE: NEGATIVE
GLUCOSE, UA: NEGATIVE mg/dL
Hgb urine dipstick: NEGATIVE
KETONES UR: NEGATIVE mg/dL
Leukocytes, UA: NEGATIVE
NITRITE: NEGATIVE
PH: 5 (ref 5.0–8.0)
Protein, ur: NEGATIVE mg/dL
SPECIFIC GRAVITY, URINE: 1.015 (ref 1.005–1.030)

## 2015-05-20 LAB — I-STAT TROPONIN, ED: Troponin i, poc: 0 ng/mL (ref 0.00–0.08)

## 2015-05-20 LAB — COMPREHENSIVE METABOLIC PANEL
ALBUMIN: 3.2 g/dL — AB (ref 3.5–5.0)
ALK PHOS: 54 U/L (ref 38–126)
ALT: 13 U/L — ABNORMAL LOW (ref 17–63)
ANION GAP: 8 (ref 5–15)
AST: 18 U/L (ref 15–41)
BUN: 17 mg/dL (ref 6–20)
CHLORIDE: 107 mmol/L (ref 101–111)
CO2: 26 mmol/L (ref 22–32)
Calcium: 8.6 mg/dL — ABNORMAL LOW (ref 8.9–10.3)
Creatinine, Ser: 1.36 mg/dL — ABNORMAL HIGH (ref 0.61–1.24)
GFR calc non Af Amer: 45 mL/min — ABNORMAL LOW (ref >60)
GFR, EST AFRICAN AMERICAN: 52 mL/min — AB (ref >60)
GLUCOSE: 117 mg/dL — AB (ref 65–99)
POTASSIUM: 3.8 mmol/L (ref 3.5–5.1)
SODIUM: 141 mmol/L (ref 135–145)
Total Bilirubin: 0.5 mg/dL (ref 0.3–1.2)
Total Protein: 6.4 g/dL — ABNORMAL LOW (ref 6.5–8.1)

## 2015-05-20 LAB — CBC
HCT: 42.7 % (ref 39.0–52.0)
Hemoglobin: 13.6 g/dL (ref 13.0–17.0)
MCH: 28.9 pg (ref 26.0–34.0)
MCHC: 31.9 g/dL (ref 30.0–36.0)
MCV: 90.9 fL (ref 78.0–100.0)
PLATELETS: 203 10*3/uL (ref 150–400)
RBC: 4.7 MIL/uL (ref 4.22–5.81)
RDW: 14.4 % (ref 11.5–15.5)
WBC: 6.8 10*3/uL (ref 4.0–10.5)

## 2015-05-20 LAB — RAPID URINE DRUG SCREEN, HOSP PERFORMED
AMPHETAMINES: NOT DETECTED
BENZODIAZEPINES: NOT DETECTED
Barbiturates: NOT DETECTED
COCAINE: NOT DETECTED
OPIATES: NOT DETECTED
TETRAHYDROCANNABINOL: NOT DETECTED

## 2015-05-20 LAB — I-STAT CHEM 8, ED
BUN: 19 mg/dL (ref 6–20)
Calcium, Ion: 1.15 mmol/L (ref 1.13–1.30)
Chloride: 105 mmol/L (ref 101–111)
Creatinine, Ser: 1.2 mg/dL (ref 0.61–1.24)
Glucose, Bld: 125 mg/dL — ABNORMAL HIGH (ref 65–99)
HCT: 45 % (ref 39.0–52.0)
Hemoglobin: 15.3 g/dL (ref 13.0–17.0)
Potassium: 3.9 mmol/L (ref 3.5–5.1)
Sodium: 142 mmol/L (ref 135–145)
TCO2: 25 mmol/L (ref 0–100)

## 2015-05-20 LAB — ETHANOL

## 2015-05-20 LAB — CBG MONITORING, ED: GLUCOSE-CAPILLARY: 117 mg/dL — AB (ref 65–99)

## 2015-05-20 LAB — APTT: aPTT: 32 seconds (ref 24–37)

## 2015-05-20 LAB — PROTIME-INR
INR: 1.23 (ref 0.00–1.49)
PROTHROMBIN TIME: 15.6 s — AB (ref 11.6–15.2)

## 2015-05-20 MED ORDER — NALOXONE HCL 0.4 MG/ML IJ SOLN
INTRAMUSCULAR | Status: AC
  Filled 2015-05-20: qty 1

## 2015-05-20 MED ORDER — NALOXONE HCL 0.4 MG/ML IJ SOLN
0.4000 mg | Freq: Once | INTRAMUSCULAR | Status: AC
  Administered 2015-05-20: 0.4 mg via INTRAVENOUS

## 2015-05-20 NOTE — Discharge Instructions (Signed)
Patient's symptoms most likely reaction to tramadol. Avoid giving tramadol the future. He had no findings on CT scan. His labs were normal. He has no urinary tract infection. He can follow-up with his regular doctor tomorrow.

## 2015-05-20 NOTE — ED Provider Notes (Signed)
CSN: [id number]     Arrival date & time 05/20/15  1015 History   First MD Initiated Contact with Patient 05/20/15 1020     Chief Complaint  Patient presents with  . Weakness  . Loss of Consciousness   (Consider location/radiation/quality/duration/timing/severity/associated sxs/prior Treatment) The history is provided by the EMS personnel. No language interpreter was used.    Level V caveat Brett Mckee is an 82 y.o male with a history of dementia, BPH, and hyperlipidemia who presents via EMS from [facility] nursing facility for sudden onset left-sided weakness with syncope followed by one episode of vomiting. EMS reports that he was dragging his left lower extremity upon arrival. Patient is oriented to self at baseline.   Past Medical History  Diagnosis Date  . Dementia   . BPH (benign prostatic hypertrophy)   . Hyperlipidemia   . Hypertension   . Diabetes mellitus without complication (HCC)    History reviewed. No pertinent past surgical history. No family history on file. Social History  Substance Use Topics  . Smoking status: Never Smoker   . Smokeless tobacco: None  . Alcohol Use: No    Review of Systems  Unable to perform ROS: Dementia      Allergies  Review of patient's allergies indicates no known allergies.  Home Medications   Prior to Admission medications   Medication Sig Start Date End Date Taking? Authorizing Provider  acetaminophen (TYLENOL) 500 MG tablet Take 1,000 mg by mouth 2 (two) times daily.   Yes Historical Provider, MD  cetirizine (ZYRTEC) 10 MG tablet Take 10 mg by mouth daily.   Yes Historical Provider, MD  donepezil (ARICEPT) 10 MG tablet Take 10 mg by mouth at bedtime.    Yes Historical Provider, MD  furosemide (LASIX) 40 MG tablet Take 20 mg by mouth daily with breakfast.    Yes Historical Provider, MD  insulin glargine (LANTUS) 100 UNIT/ML injection Inject 10 Units into the skin daily.   Yes Historical Provider, MD  Lactase (LACTAID PO)  Take 1 tablet by mouth 3 (three) times daily.   Yes Historical Provider, MD  loperamide (IMODIUM A-D) 2 MG tablet Take 2 mg by mouth every morning.   Yes Historical Provider, MD  LORazepam (ATIVAN) 0.5 MG tablet Take 0.5 mg by mouth every 6 (six) hours as needed for anxiety (for anxiety/agitation).   Yes Historical Provider, MD  potassium chloride SA (K-DUR,KLOR-CON) 20 MEQ tablet Take 20 mEq by mouth every morning.    Yes Historical Provider, MD  ranitidine (ZANTAC) 75 MG tablet Take 150 mg by mouth every morning.    Yes Historical Provider, MD  Skin Protectants, Misc. (BAZA PROTECT EX) Apply 1 application topically as needed (for each incontinent episode. Notify MD if sx persist longer than 1 wk).   Yes Historical Provider, MD  traMADol (ULTRAM) 50 MG tablet Take 25-50 mg by mouth 2 (two) times daily. 50 mg in the am and  at bedtime   Yes Historical Provider, MD   BP 164/93 mmHg  Pulse 74  Temp(Src) 97.4 F (36.3 C) (Oral)  Resp 23  Wt 96.163 kg  SpO2 96% Physical Exam  Constitutional: He appears well-developed and well-nourished. No distress.  HENT:  Head: Normocephalic and atraumatic.  Eyes: Conjunctivae are normal.  Neck: Normal range of motion. Neck supple.  Cardiovascular: Normal rate, regular rhythm and normal heart sounds.   Pulmonary/Chest: Effort normal and breath sounds normal. No respiratory distress.  Abdominal: Soft. There is no tenderness.  Musculoskeletal:  Normal range of motion.  Neurological: He is alert. A cranial nerve deficit is present. Coordination abnormal.  Obvious left upper and lower extremity weakness compared to right. Unable to hold arm up. Slight facial droop when smiling or puffing out cheeks. Able to tell me his name and birthdate correctly. No slurred speech. Cranial nerves III, IV, VI intact.  Skin: Skin is warm and dry. He is not diaphoretic.  Nursing note and vitals reviewed.   ED Course  Procedures (including critical care time) Labs  Review Labs Reviewed  PROTIME-INR - Abnormal; Notable for the following:    Prothrombin Time 15.6 (*)    All other components within normal limits  I-STAT CHEM 8, ED - Abnormal; Notable for the following:    Glucose, Bld 125 (*)    All other components within normal limits  CBG MONITORING, ED - Abnormal; Notable for the following:    Glucose-Capillary 117 (*)    All other components within normal limits  APTT  CBC  DIFFERENTIAL  URINE RAPID DRUG SCREEN, HOSP PERFORMED  URINALYSIS, ROUTINE W REFLEX MICROSCOPIC (NOT AT [facility])  ETHANOL  COMPREHENSIVE METABOLIC PANEL  I-STAT TROPOININ, ED    Imaging Review Ct Head Wo Contrast  05/20/2015  CLINICAL DATA:  Left side weakness, code stroke EXAM: CT HEAD WITHOUT CONTRAST TECHNIQUE: Contiguous axial images were obtained from the base of the skull through the vertex without intravenous contrast. COMPARISON:  05/08/2015 FINDINGS: No intracranial hemorrhage, mass effect or midline shift. Stable cerebral atrophy. Stable periventricular chronic white matter disease. No definite acute cortical infarction. Ventricular size is stable from prior exam. Paranasal sinuses and mastoid air cells are unremarkable. Tiny amount probable venous air noted in right cavernous sinus probable from recent venous puncture. No mass lesion is noted on this unenhanced scan. No intraventricular hemorrhage. IMPRESSION: No acute intracranial abnormality. Stable atrophy and chronic white matter disease. No definite acute cortical infarction. Electronically Signed   By: Natasha MeadLiviu  Pop M.D.   On: 05/20/2015 11:14   I have personally reviewed and evaluated these images and lab results as part of my medical decision-making.   EKG Interpretation   Date/Time:  Thursday May 20 2015 10:31:41 EDT Ventricular Rate:  107 PR Interval:    QRS Duration: 111 QT Interval:  419 QTC Calculation: 559 R Axis:   53 Text Interpretation:  Since last tracing of earlier today appears to be in  NSR  Confirmed by WENTZ  MD, ELLIOTT ([id number]) on 05/20/2015 11:08:09 AM      MDM   Final diagnoses:  Medication reaction, initial encounter  Patient presented for left-sided weakness that began 3 hours ago.  Stroke was called. Dr. Lavon PaganiniNandigam from neurology saw the patient. He believes he is cleared from a neurology standpoint. He reports the patient was given tramadol this morning and his pupils were dilated. He believes that this may be caused by the medication. They administered Narcan and the patient no longer has focal weakness and has perked up. Patient also vomited couple times this morning. CT head is negative for any intracranial abnormality. EKG is similar to previous. CBG is 117. Labs appear normal. Troponin negative. Urinalysis is normal also. I believe the patient is safe for discharge back to nursing facility. Dr. Effie ShyWentz has seen the patient and agrees with plan.   Catha GosselinHanna Patel-Mills, PA-C 05/20/15 1332  Mancel BaleElliott Wentz, MD 05/20/15 1538

## 2015-05-20 NOTE — ED Notes (Signed)
[facility], EMS reporting sudden onset weakness with syncope.followed by 1 episode vomiting.  EMS reports staff reported LLE "dragging".  Upon arrival LUE noted to be weaker with drift.  Dr. Effie ShyWentz made aware.  Per EMS staff reports pt oriented only to self at baseline.  Per Dr Effie ShyWentz @ 1027 "not a code stroke".

## 2015-05-20 NOTE — ED Notes (Signed)
Canceled code stroke @ 11:10a

## 2015-05-20 NOTE — ED Notes (Signed)
Called lab for result for blood work. Stated does not have blood work to run test.

## 2015-05-20 NOTE — Code Documentation (Signed)
82[facility] [id number].  Patient from nursing facility where he had therapy this morning and was at his baseline.  Patient was witnessed by staff to sit down at the table at 0800 then ambulated at 0830 and was dragging his left leg.  Patient with episode of vomiting.  Patient with h/o dementia.  Code stroke activated in the ED.  Stroke team to the bedside.  Patient transported to CT.  Dr. Lavon PaganiniNandigam to the bedside.  NIHSS 4, see documentation for details and code stroke times.  Patient is drowsy, unable to answer age or month with left arm drift on exam.  Of note, patient's son at the bedside and reports patient is not able to answer those questions at baseline.  ED RN called facility who reports patient received Tramadol 50mg  at 0800.  Narcan given per MD order.  No acute stroke treatment at this time per Dr. Lavon PaganiniNandigam.  Code stroke canceled.  Bedside handoff with ED RN Steward DroneBrenda.

## 2015-05-20 NOTE — ED Notes (Signed)
Code stroke called @ 10:44a.

## 2015-05-20 NOTE — ED Notes (Signed)
[facility] called for pickup and transportation to [facility].

## 2015-05-20 NOTE — ED Notes (Signed)
Called [facility].  They are on their way to pick up pt.

## 2015-05-20 NOTE — ED Provider Notes (Signed)
  Face-to-face evaluation   History: Patient from nursing home, confused, unable to give history, presenting with history of "syncope," and dragging left leg. Reportedly was his usual self, before the syncope, which occurred around 9 AM.  Physical exam: Elderly man who is alert and responsive, follows commands. No facial asymmetry. Symmetric grip strength bilaterally. Mild pain with movement of left arm. Normal plantar flexion feet.  Initial clinical impression- 10:28 AM- Nonspecific syncope, no focal asymmetry, or concern for acute CVA, which would require thrombolysis.  Medical screening examination/treatment/procedure(s) were conducted as a shared visit with non-physician practitioner(s) and myself.  I personally evaluated the patient during the encounter  Mancel BaleElliott Aidaly Cordner, MD 05/20/15 1538

## 2015-05-20 NOTE — ED Notes (Signed)
Lab found blood and running labs provider notified.

## 2015-05-20 NOTE — ED Notes (Signed)
Family was concerned pt had L facial droop.  PA Dahlia ClientHannah speaking with family.  Pt remains neurologically the same as when neuro assessed pt.

## 2015-12-27 IMAGING — CR DG CHEST 1V PORT
1 series · 1 of 1 positions shown · non-contrast
Comparison: 12/29/2013

CLINICAL DATA: Wheezing, shortness of Breath

EXAM:
PORTABLE CHEST - 1 VIEW

[AP]
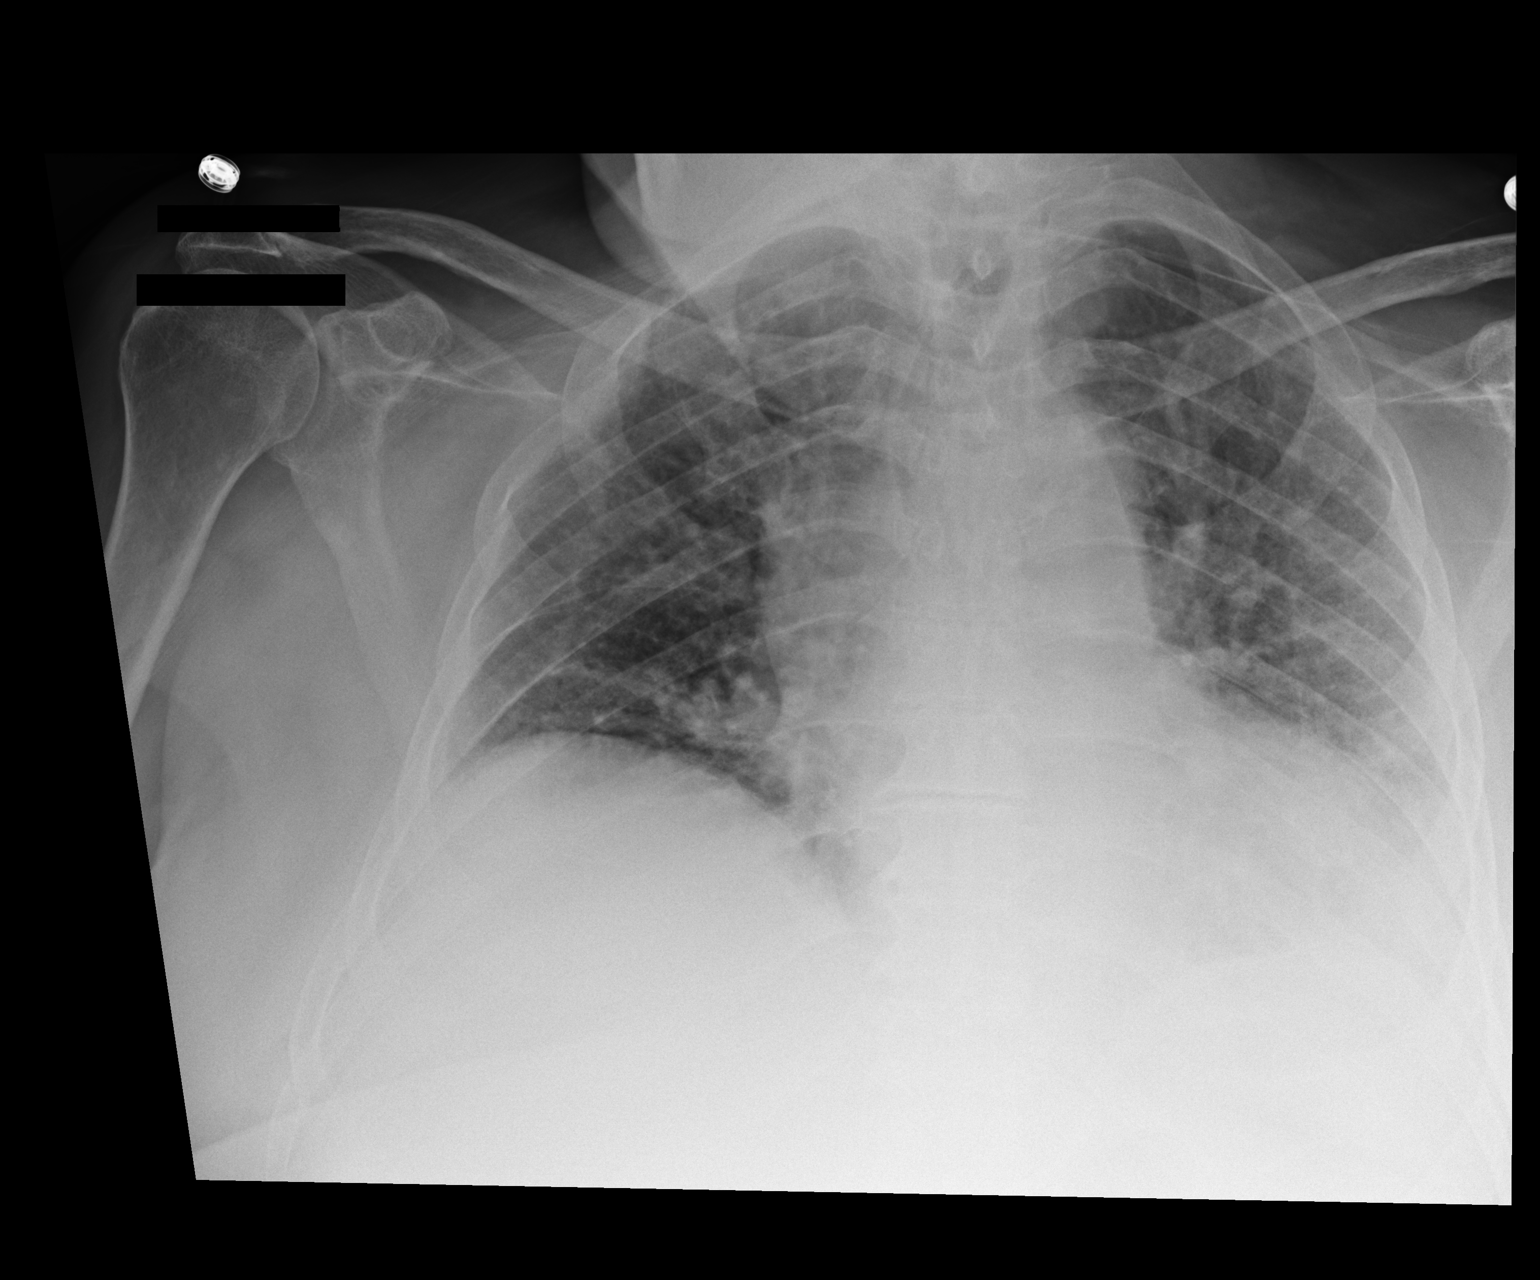

[1 of 1 positions shown; findings below may reference images not displayed]

FINDINGS: Cardiomediastinal silhouette is stable. Again noted elevation of the
right hemidiaphragm with linear atelectasis right base. Question
central mild bronchitic changes. No segmental infiltrate. Mild left
basilar atelectasis.
IMPRESSION: No segmental infiltrate. Question central mild bronchitic changes.
Mild basilar atelectasis. Chronic elevation of the right
hemidiaphragm.

## 2016-01-10 IMAGING — CR DG CHEST 1V
1 series · 1 of 1 positions shown · non-contrast
Comparison: 12/31/2013

CLINICAL DATA: Hyperglycemia, decreased responsiveness

EXAM:
CHEST - 1 VIEW

[x chest ap]
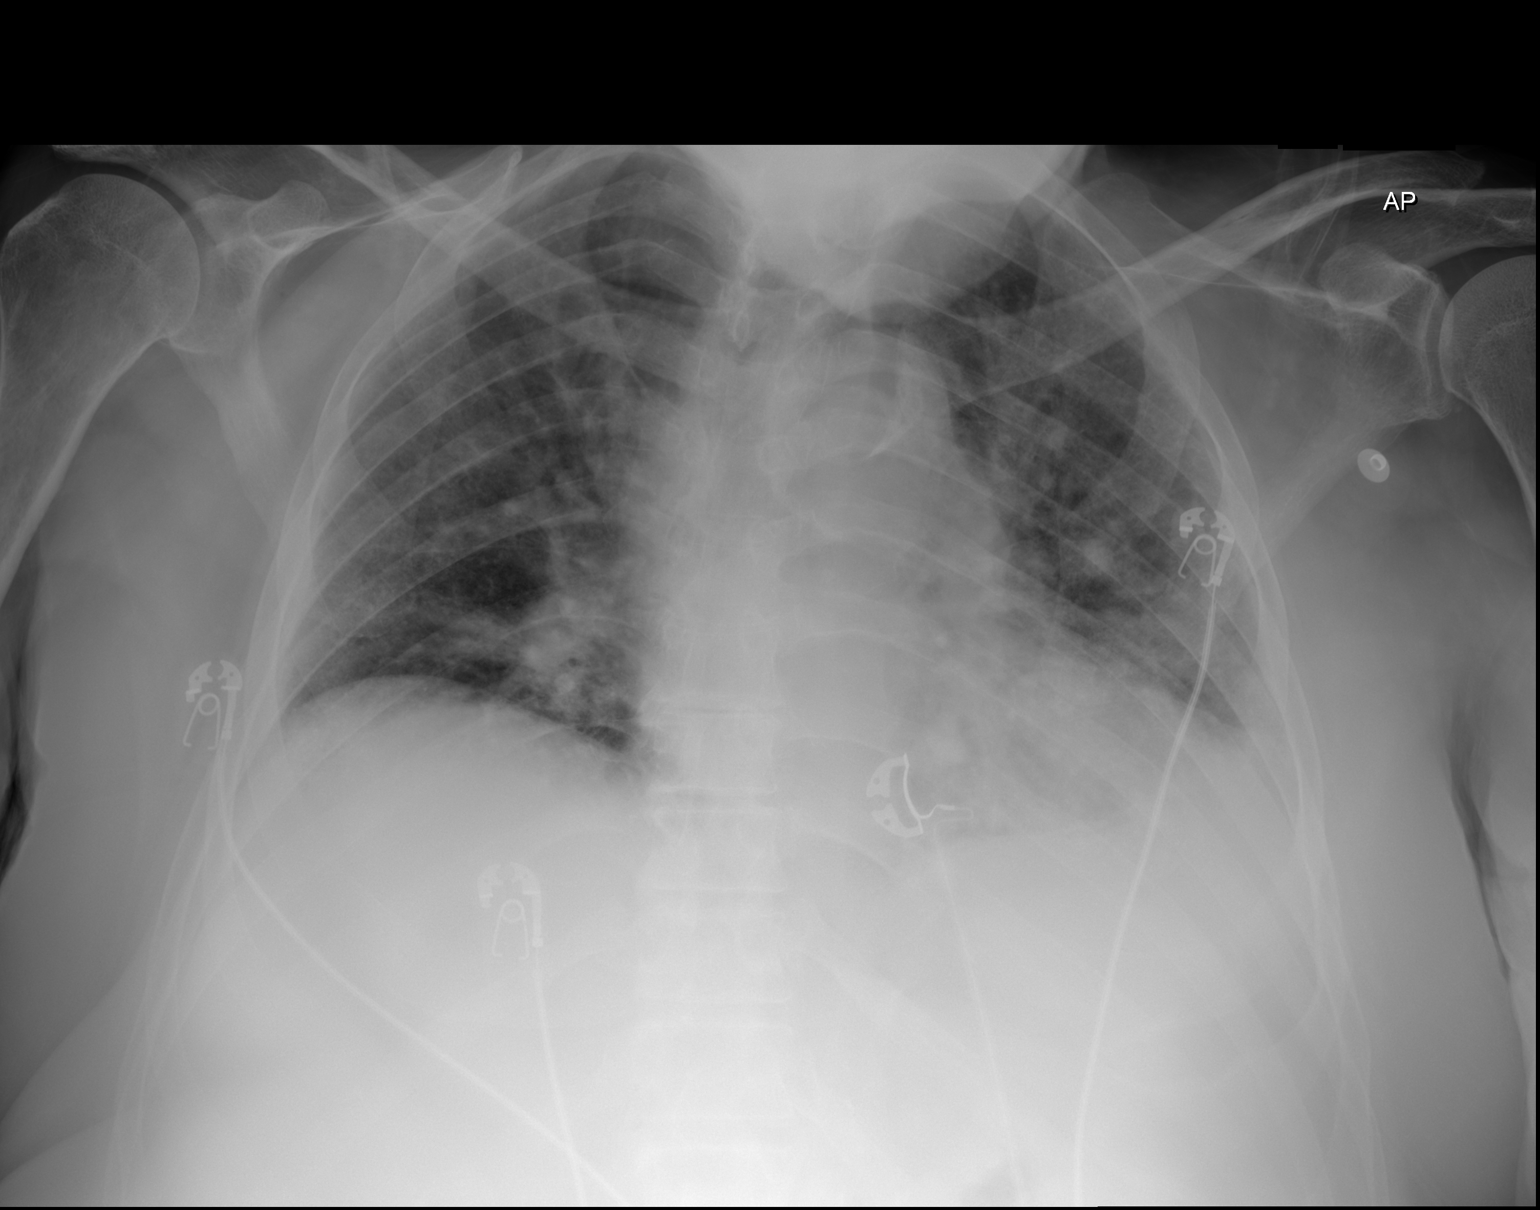

[1 of 1 positions shown; findings below may reference images not displayed]

FINDINGS: Cardiac shadow is stable. Patchy bronchitic changes are noted
bilaterally more marked in the bases which have increased from the
prior exam. No focal confluent infiltrate is seen.
IMPRESSION: Increased bronchitic changes which are increased in the basis when
compared with the prior exam.

## 2016-01-10 IMAGING — US US RENAL
1 series · 14 of 25 positions shown · non-contrast
Comparison: None.

CLINICAL DATA: Acute renal failure.

EXAM:
RENAL/URINARY TRACT ULTRASOUND COMPLETE

[Series 1: us renal · 0.28mm/px · 14 of 30 slices shown]
[im 1/30]
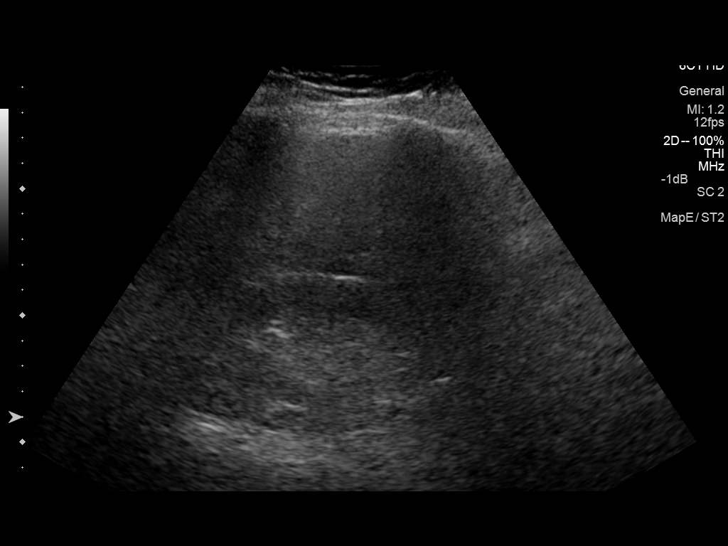
[im 3/30]
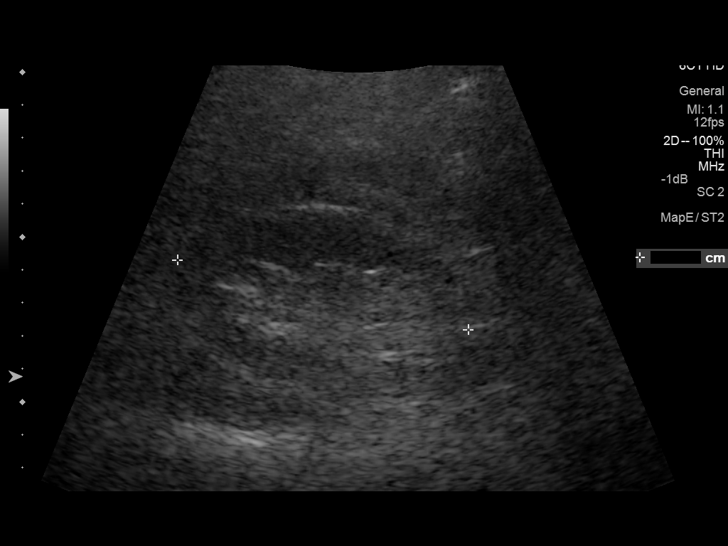
[im 5/30]
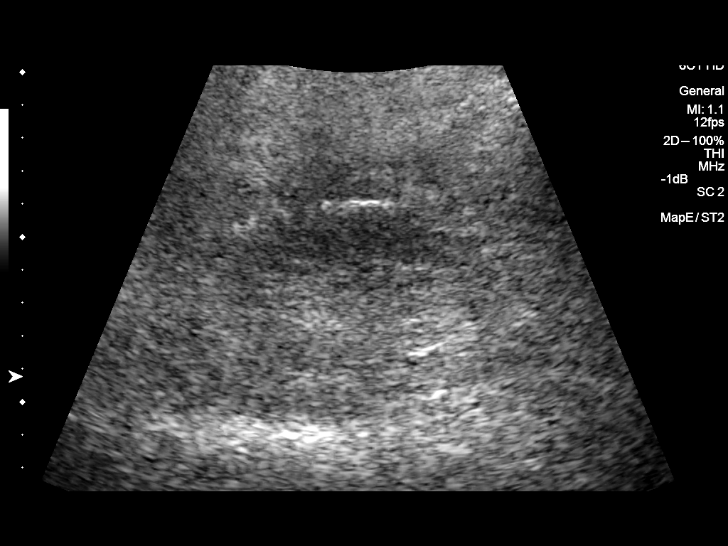
[im 8/30]
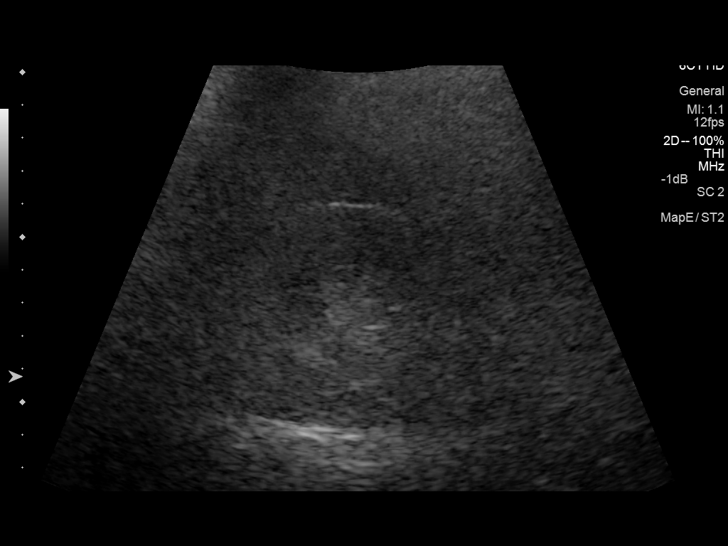
[im 10/30]
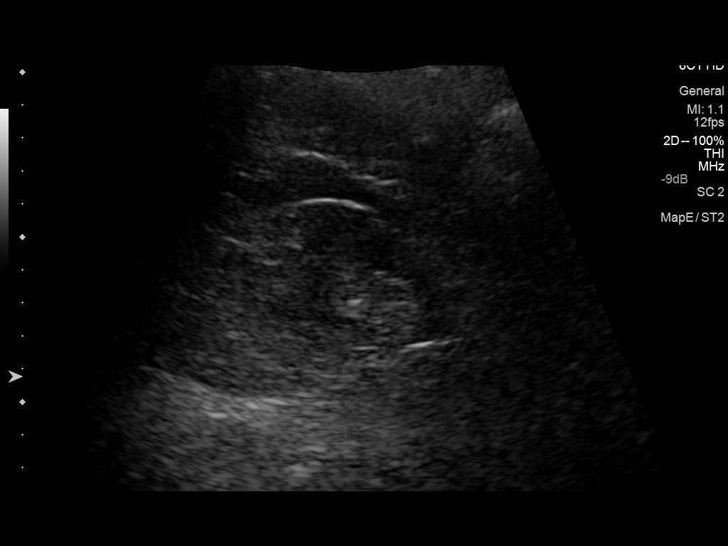
[im 11/30]
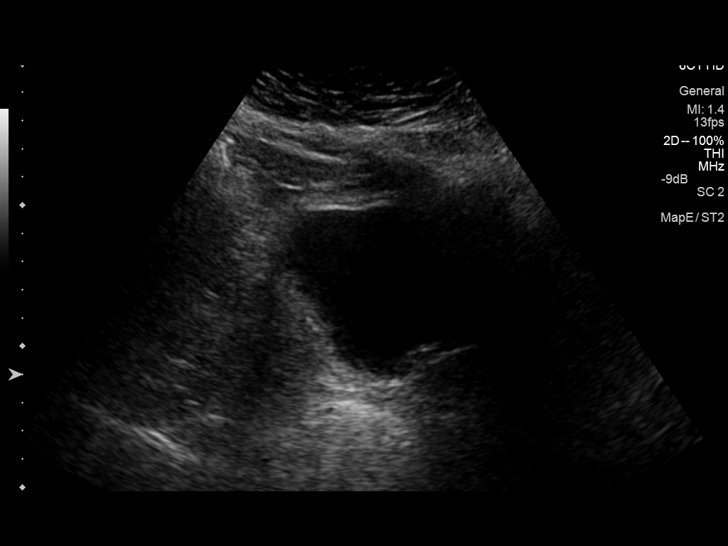
[im 14/30]
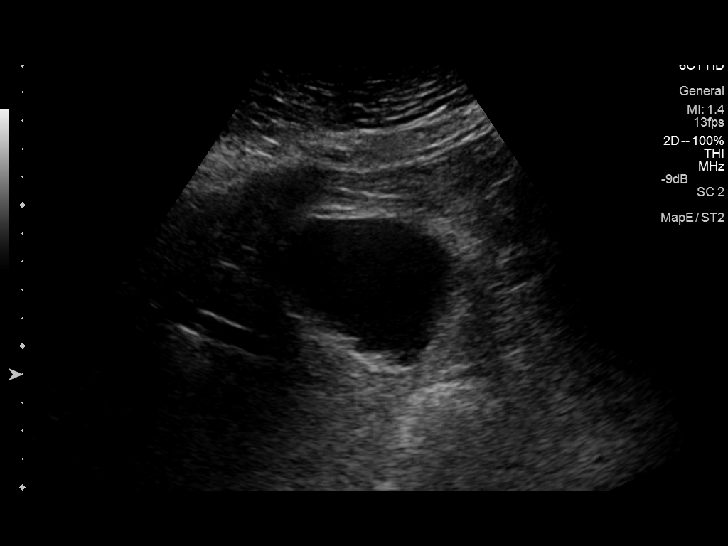
[im 16/30]
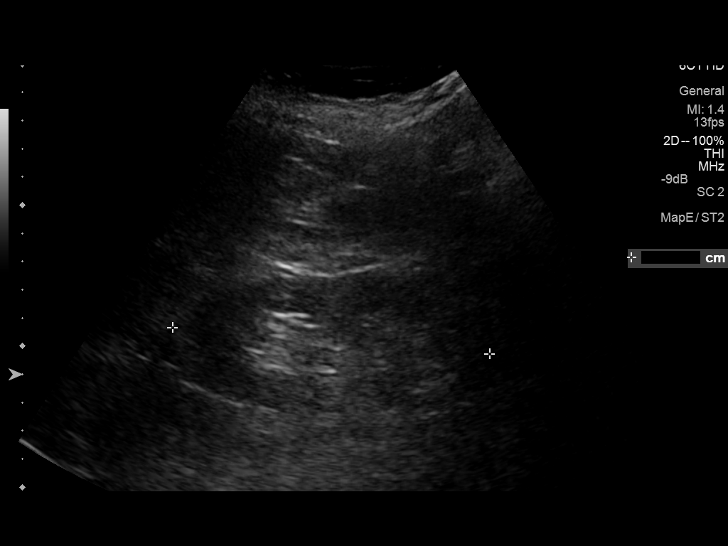
[im 19/30]
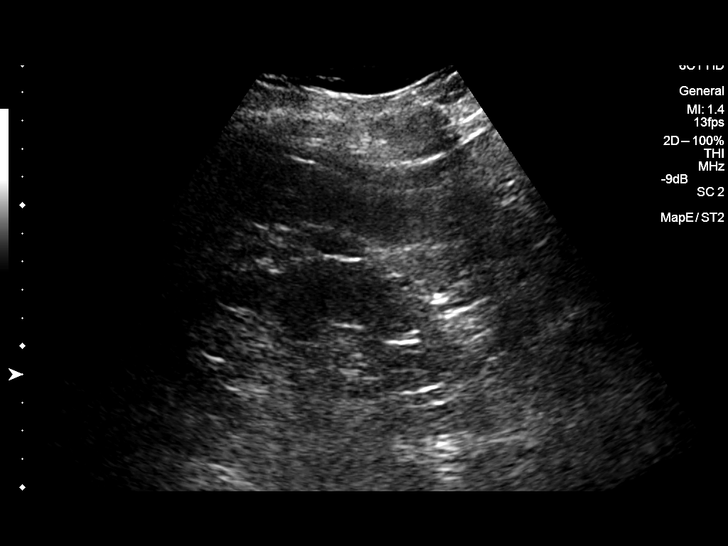
[im 20/30]
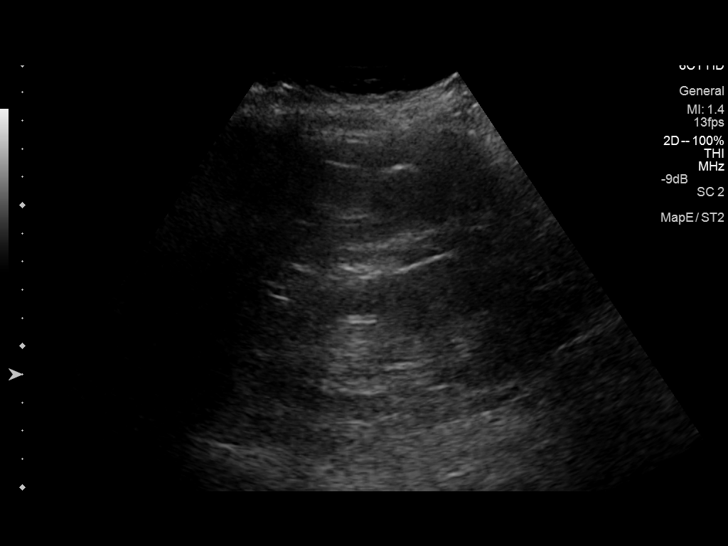
[im 22/30]
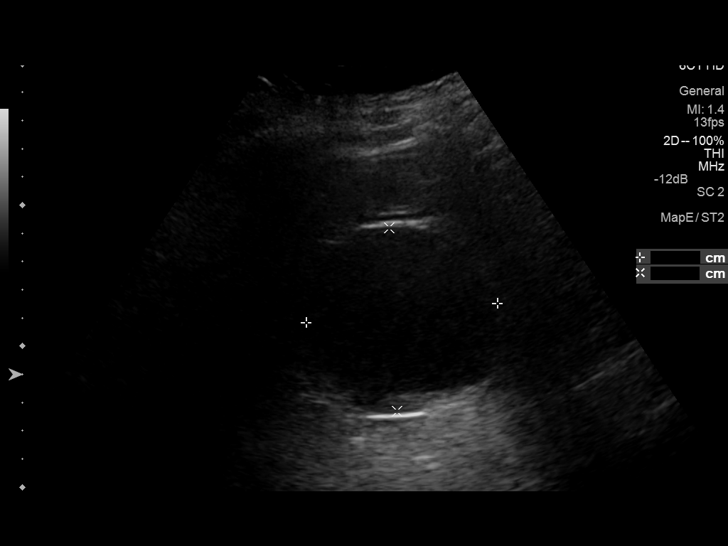
[im 25/30]
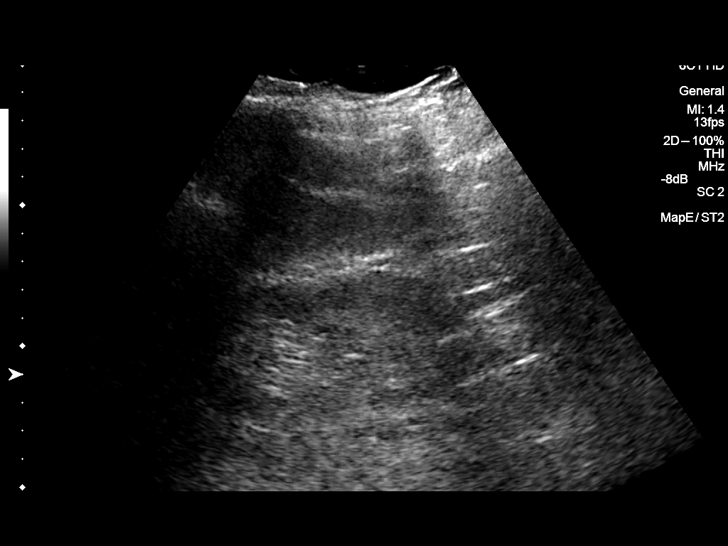
[im 27/30]
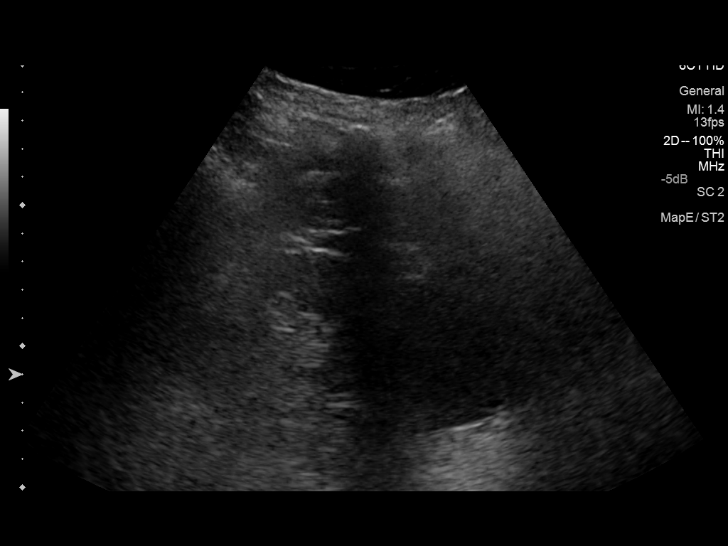
[im 30/30]
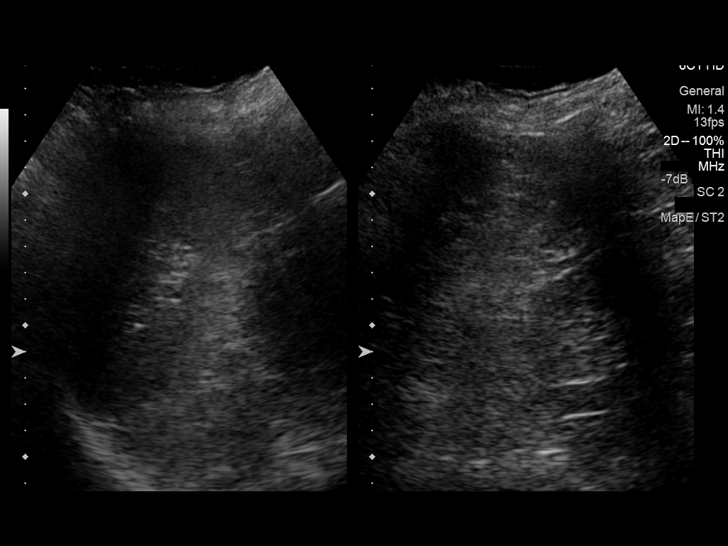

[14 of 25 positions shown; findings below may reference images not displayed]

FINDINGS: Right Kidney:

Length: 9.0 cm, limited visualization. Echogenicity within normal
limits. No mass or hydronephrosis visualized.

Left Kidney:

Length: 11.7 cm. 6.8 cm simple appearing cyst noted laterally off
the midpole. Echogenicity within normal limits. No mass or
hydronephrosis visualized.

Bladder:

Appears normal for degree of bladder distention.
IMPRESSION: No evidence of hydronephrosis.  No acute findings.

## 2016-04-08 ENCOUNTER — Emergency Department (HOSPITAL_COMMUNITY)
Admission: EM | Admit: 2016-04-08 | Discharge: 2016-04-08 | Disposition: A | Discharge status: Home / Self Care | Attending: Emergency Medicine | Admitting: Emergency Medicine

## 2016-04-08 ENCOUNTER — Emergency Department (HOSPITAL_COMMUNITY)

## 2016-04-08 ENCOUNTER — Encounter (HOSPITAL_COMMUNITY): Payer: Self-pay

## 2016-04-08 DIAGNOSIS — Y929 Unspecified place or not applicable: Secondary | ICD-10-CM | POA: Insufficient documentation

## 2016-04-08 DIAGNOSIS — Z79899 Other long term (current) drug therapy: Secondary | ICD-10-CM | POA: Diagnosis not present

## 2016-04-08 DIAGNOSIS — S0083XA Contusion of other part of head, initial encounter: Secondary | ICD-10-CM

## 2016-04-08 DIAGNOSIS — F0390 Unspecified dementia without behavioral disturbance: Secondary | ICD-10-CM | POA: Diagnosis not present

## 2016-04-08 DIAGNOSIS — Y939 Activity, unspecified: Secondary | ICD-10-CM | POA: Insufficient documentation

## 2016-04-08 DIAGNOSIS — W19XXXA Unspecified fall, initial encounter: Secondary | ICD-10-CM

## 2016-04-08 DIAGNOSIS — Y999 Unspecified external cause status: Secondary | ICD-10-CM | POA: Diagnosis not present

## 2016-04-08 DIAGNOSIS — E119 Type 2 diabetes mellitus without complications: Secondary | ICD-10-CM | POA: Insufficient documentation

## 2016-04-08 DIAGNOSIS — W050XXA Fall from non-moving wheelchair, initial encounter: Secondary | ICD-10-CM | POA: Diagnosis not present

## 2016-04-08 DIAGNOSIS — I10 Essential (primary) hypertension: Secondary | ICD-10-CM | POA: Insufficient documentation

## 2016-04-08 MED ORDER — HYDROCODONE-ACETAMINOPHEN 5-325 MG PO TABS
1.0000 | ORAL_TABLET | Freq: Once | ORAL | Status: AC
  Administered 2016-04-08: 1 via ORAL
  Filled 2016-04-08: qty 1

## 2016-04-08 MED ORDER — BACITRACIN ZINC 500 UNIT/GM EX OINT
1.0000 "application " | TOPICAL_OINTMENT | Freq: Once | CUTANEOUS | Status: AC
  Administered 2016-04-08: 1 via TOPICAL
  Filled 2016-04-08: qty 0.9

## 2016-04-08 MED ORDER — ACETAMINOPHEN-CODEINE #3 300-30 MG PO TABS: 1.0000 | ORAL_TABLET | Freq: Four times a day (QID) | ORAL | 0 refills | Status: AC | PRN

## 2016-04-08 MED ORDER — HYDROCODONE-ACETAMINOPHEN 5-325 MG PO TABS
ORAL_TABLET | ORAL | 0 refills | Status: DC
Start: 2016-04-08 — End: 2016-04-08

## 2016-04-08 NOTE — ED Notes (Signed)
Wound cleaned with wound cleanser, bacitracin applied, and wound dressed. Ice applied.

## 2016-04-08 NOTE — ED Triage Notes (Signed)
Pt BIB [facility] from [facility] [place]. C/o fall out of his wheelchair. Facility denies LOC, no blood thinners. Hematoma noted on R side of forehead. Also not a skin tear on L ring finger. Pt was incontinent of urine when facility found him. Hx dementia.

## 2016-04-08 NOTE — ED Notes (Signed)
Bed: WA04 Expected date:  Expected time:  Means of arrival:  Comments: fall 

## 2016-04-08 NOTE — Discharge Instructions (Signed)
Please follow with your primary care doctor in the next 2 days for a check-up. They must obtain records for further management.  ° °Do not hesitate to return to the Emergency Department for any new, worsening or concerning symptoms.  ° °

## 2016-04-08 NOTE — ED Provider Notes (Signed)
[facility]-EMERGENCY DEPT Provider Note   CSN: [id number] Arrival date & time: 04/08/16  1837     History   Chief Complaint Chief Complaint  Patient presents with  . Fall    HPI    Blood pressure 132/70, pulse 70, temperature 97.5 F (36.4 C), temperature source Oral, resp. rate 16, SpO2 100 %.  Brett Mckee is a 83 y.o. male who is DO NOT RESUSCITATE with diabetes and dementia, accompanied by daughter and son-in-law sent from SNF for fall. Patient is not anticoagulated. He was sitting in a wheelchair and fell forward. He is a large contusion to the forehead. As per his daughter is mentating at baseline.Level V caveat secondary to dementia   Past Medical History:  Diagnosis Date  . BPH (benign prostatic hypertrophy)   . Dementia   . Diabetes mellitus without complication (HCC)   . Hyperlipidemia   . Hypertension     Patient Active Problem List   Diagnosis Date Noted  . Hyperglycemia 01/14/2014  . Type 2 diabetes mellitus with hyperosmolar nonketotic hyperglycemia (HCC) 01/14/2014  . Aspiration pneumonia (HCC) 01/01/2014  . Bacteremia 12/31/2013  . Acute cystitis without hematuria   . UTI (lower urinary tract infection) 12/29/2013  . Sepsis (HCC) 12/29/2013  . Dehydration 12/03/2012  . Edema 12/03/2012  . Fall 12/01/2012  . AKI (acute kidney injury) (HCC) 12/01/2012  . Dementia 12/01/2012  . HTN (hypertension) 12/01/2012    History reviewed. No pertinent surgical history.     Home Medications    Prior to Admission medications   Medication Sig Start Date End Date Taking? Authorizing Provider  acetaminophen (TYLENOL) 500 MG tablet Take 500 mg by mouth 3 (three) times daily.    Yes Historical Provider, MD  dextromethorphan-guaiFENesin (TUSSIN DM) 10-100 MG/5ML liquid Take 15 mLs by mouth 2 (two) times daily as needed for cough (and/or congestion).   Yes Historical Provider, MD  furosemide (LASIX) 20 MG tablet Take 20 mg by mouth every other day.   Yes  Historical Provider, MD  lactase (LACTAID) 3000 units tablet Take 3,000 Units by mouth 3 (three) times daily with meals.   Yes Historical Provider, MD  loperamide (IMODIUM) 2 MG capsule Take 2 mg by mouth daily.   Yes Historical Provider, MD  LORazepam (ATIVAN) 0.5 MG tablet Take 0.5 mg by mouth every 6 (six) hours as needed (for agitation).    Yes Historical Provider, MD  potassium chloride SA (K-DUR,KLOR-CON) 20 MEQ tablet Take 20 mEq by mouth every other day.    Yes Historical Provider, MD  Skin Protectants, Misc. (BAZA PROTECT EX) Apply 1 application topically as needed (after each incontinent episode).    Yes Historical Provider, MD  acetaminophen-codeine (TYLENOL #3) 300-30 MG tablet Take 1-2 tablets by mouth every 6 (six) hours as needed for moderate pain. 04/08/16   Marcelia Petersen, PA-C  HYDROcodone-acetaminophen (NORCO/VICODIN) 5-325 MG tablet Take 1 tablets by mouth every 6 hours as needed for pain. 04/08/16   Joni ReiningNicole Preeti Winegardner, PA-C    Family History History reviewed. No pertinent family history.  Social History Social History  Substance Use Topics  . Smoking status: Never Smoker  . Smokeless tobacco: Not on file  . Alcohol use No     Allergies   Patient has no known allergies.   Review of Systems Review of Systems  10 systems reviewed and found to be negative, except as noted in the HPI.   Physical Exam Updated Vital Signs BP 132/70 (BP Location: Left Arm)  Pulse 70   Temp 97.5 F (36.4 C) (Oral)   Resp 16   SpO2 100%   Physical Exam  Constitutional: He is oriented to person, place, and time. He appears well-developed and well-nourished. No distress.  HENT:  Head: Normocephalic.  Mouth/Throat: Oropharynx is clear and moist.  Large contusion to right forehead with partial-thickness abrasion overlying, pupils constricted but reactive. Extraocular movement grossly intact.  Eyes: Conjunctivae and EOM are normal. Pupils are equal, round, and reactive to light.    Neck: Normal range of motion.  Cardiovascular: Normal rate, regular rhythm and intact distal pulses.   Pulmonary/Chest: Effort normal and breath sounds normal.  Abdominal: Soft. There is no tenderness.  Musculoskeletal: Normal range of motion.  Neurological: He is alert and oriented to person, place, and time.  Skin: He is not diaphoretic.  Psychiatric: He has a normal mood and affect.  Nursing note and vitals reviewed.    ED Treatments / Results  Labs (all labs ordered are listed, but only abnormal results are displayed) Labs Reviewed - No data to display  EKG  EKG Interpretation None       Radiology Ct Head Wo Contrast  Result Date: 04/08/2016 CLINICAL DATA:  Status post fall out of wheelchair. Right-sided forehead hematoma. EXAM: CT HEAD WITHOUT CONTRAST CT CERVICAL SPINE WITHOUT CONTRAST TECHNIQUE: Multidetector CT imaging of the head and cervical spine was performed following the standard protocol without intravenous contrast. Multiplanar CT image reconstructions of the cervical spine were also generated. COMPARISON:  CT of the head performed 05/20/2015, and CT of the cervical spine performed 05/08/2015 FINDINGS: CT HEAD FINDINGS Brain: No evidence of acute infarction, hemorrhage, hydrocephalus, extra-axial collection or mass lesion/mass effect. Prominence of the ventricles and sulci reflects moderately severe cortical volume loss. Cerebellar atrophy is noted. Mild periventricular white matter change likely reflects small vessel ischemic microangiopathy. The brainstem and fourth ventricle are within normal limits. The basal ganglia are unremarkable in appearance. The cerebral hemispheres demonstrate grossly normal gray-white differentiation. No mass effect or midline shift is seen. Vascular: No hyperdense vessel or unexpected calcification. Skull: There is no evidence of fracture; visualized osseous structures are unremarkable in appearance. Sinuses/Orbits: The visualized portions  of the orbits are within normal limits. The paranasal sinuses and mastoid air cells are well-aerated. Other: Soft tissue swelling is noted overlying the right frontal calvarium. CT CERVICAL SPINE FINDINGS Alignment: Reversal of the normal lordotic curvature of the cervical spine appears to be chronic in nature. There is chronic osseous fusion at C5-C7. There is minimal grade 1 anterolisthesis of C2 on C3, of C3 on C4 and of C4 on C5, with underlying facet disease. Skull base and vertebrae: No acute fracture. No primary bone lesion or focal pathologic process. Soft tissues and spinal canal: No prevertebral fluid or swelling. No visible canal hematoma. Disc levels: Prominent anterior and posterior disc osteophyte complexes are seen along the lower cervical spine. Upper chest: Mild atelectasis is noted at the lung apices. The thyroid gland is unremarkable in appearance. Other: No additional soft tissue abnormalities are seen. IMPRESSION: 1. No evidence of traumatic intracranial injury or fracture. 2. No evidence of acute fracture or subluxation along the cervical spine. 3. Soft tissue swelling overlying the right frontal calvarium. 4. Moderately severe cortical volume loss and mild small vessel ischemic microangiopathy. 5. Degenerative change along the cervical spine, with chronic osseous fusion at C5-C7. 6. Mild atelectasis at the lung apices. Electronically Signed   By: Roanna Raider M.D.   On: 04/08/2016  20:05   Ct Cervical Spine Wo Contrast  Result Date: 04/08/2016 CLINICAL DATA:  Status post fall out of wheelchair. Right-sided forehead hematoma. EXAM: CT HEAD WITHOUT CONTRAST CT CERVICAL SPINE WITHOUT CONTRAST TECHNIQUE: Multidetector CT imaging of the head and cervical spine was performed following the standard protocol without intravenous contrast. Multiplanar CT image reconstructions of the cervical spine were also generated. COMPARISON:  CT of the head performed 05/20/2015, and CT of the cervical spine  performed 05/08/2015 FINDINGS: CT HEAD FINDINGS Brain: No evidence of acute infarction, hemorrhage, hydrocephalus, extra-axial collection or mass lesion/mass effect. Prominence of the ventricles and sulci reflects moderately severe cortical volume loss. Cerebellar atrophy is noted. Mild periventricular white matter change likely reflects small vessel ischemic microangiopathy. The brainstem and fourth ventricle are within normal limits. The basal ganglia are unremarkable in appearance. The cerebral hemispheres demonstrate grossly normal gray-white differentiation. No mass effect or midline shift is seen. Vascular: No hyperdense vessel or unexpected calcification. Skull: There is no evidence of fracture; visualized osseous structures are unremarkable in appearance. Sinuses/Orbits: The visualized portions of the orbits are within normal limits. The paranasal sinuses and mastoid air cells are well-aerated. Other: Soft tissue swelling is noted overlying the right frontal calvarium. CT CERVICAL SPINE FINDINGS Alignment: Reversal of the normal lordotic curvature of the cervical spine appears to be chronic in nature. There is chronic osseous fusion at C5-C7. There is minimal grade 1 anterolisthesis of C2 on C3, of C3 on C4 and of C4 on C5, with underlying facet disease. Skull base and vertebrae: No acute fracture. No primary bone lesion or focal pathologic process. Soft tissues and spinal canal: No prevertebral fluid or swelling. No visible canal hematoma. Disc levels: Prominent anterior and posterior disc osteophyte complexes are seen along the lower cervical spine. Upper chest: Mild atelectasis is noted at the lung apices. The thyroid gland is unremarkable in appearance. Other: No additional soft tissue abnormalities are seen. IMPRESSION: 1. No evidence of traumatic intracranial injury or fracture. 2. No evidence of acute fracture or subluxation along the cervical spine. 3. Soft tissue swelling overlying the right frontal  calvarium. 4. Moderately severe cortical volume loss and mild small vessel ischemic microangiopathy. 5. Degenerative change along the cervical spine, with chronic osseous fusion at C5-C7. 6. Mild atelectasis at the lung apices. Electronically Signed   By: Roanna Raider M.D.   On: 04/08/2016 20:05    Procedures Procedures (including critical care time)  Medications Ordered in ED Medications  bacitracin ointment 1 application (1 application Topical Given 04/08/16 1955)  HYDROcodone-acetaminophen (NORCO/VICODIN) 5-325 MG per tablet 1 tablet (1 tablet Oral Given 04/08/16 1955)     Initial Impression / Assessment and Plan / ED Course  I have reviewed the triage vital signs and the nursing notes.  Pertinent labs & imaging results that were available during my care of the patient were reviewed by me and considered in my medical decision making (see chart for details).     Vitals:   04/08/16 1850  BP: 132/70  Pulse: 70  Resp: 16  Temp: 97.5 F (36.4 C)  TempSrc: Oral  SpO2: 100%    Medications  bacitracin ointment 1 application (1 application Topical Given 04/08/16 1955)  HYDROcodone-acetaminophen (NORCO/VICODIN) 5-325 MG per tablet 1 tablet (1 tablet Oral Given 04/08/16 1955)    Brett Mckee is 83 y.o. male presenting with facial contusion status post mechanical fall, he is mentating at his baseline. Tetanus is up-to-date. CT head and C-spine negative. Counseled them on  wound care and return precautions.  This is a shared visit with the attending physician who personally evaluated the patient and agrees with the care plan.   Evaluation does not show pathology that would require ongoing emergent intervention or inpatient treatment. Pt is hemodynamically stable and mentating appropriately. Discussed findings and plan with patient/guardian, who agrees with care plan. All questions answered. Return precautions discussed and outpatient follow up given.    Final Clinical  Impressions(s) / ED Diagnoses   Final diagnoses:  Fall, initial encounter  Contusion of face, initial encounter    New Prescriptions New Prescriptions   ACETAMINOPHEN-CODEINE (TYLENOL #3) 300-30 MG TABLET    Take 1-2 tablets by mouth every 6 (six) hours as needed for moderate pain.   HYDROCODONE-ACETAMINOPHEN (NORCO/VICODIN) 5-325 MG TABLET    Take 1 tablets by mouth every 6 hours as needed for pain.     Wynetta Emery, PA-C 04/08/16 2051

## 2016-04-08 NOTE — ED Provider Notes (Signed)
Medical screening examination/treatment/procedure(s) were conducted as a shared visit with non-physician practitioner(s) and myself.  I personally evaluated the patient during the encounter.   EKG Interpretation None     83 year old male here after mechanical fall. Patient's imaging negative. He is at his baseline and will discharge him   Lorre NickAnthony Rmani Kapusta, MD 04/08/16 2041

## 2016-07-03 IMAGING — CT CT HEAD W/O CM
3 of 7 series · 13 of 47 positions shown, 15 images · non-contrast
Comparison: 01/24/2014

CLINICAL DATA: 88-year-old male post fall in shower. Now with neck
pain. Laceration to posterior head.

EXAM:
CT HEAD WITHOUT CONTRAST
CT CERVICAL SPINE WITHOUT CONTRAST
TECHNIQUE: Multidetector CT imaging of the head and cervical spine was
performed following the standard protocol without intravenous
contrast. Multiplanar CT image reconstructions of the cervical spine
were also generated.

[Series 8: axial reformats · axial · 0.23mm/px · z∈[+1183,+1293]mm · 7 of 90 slices shown, 9 images]
[im 12/90  brain]
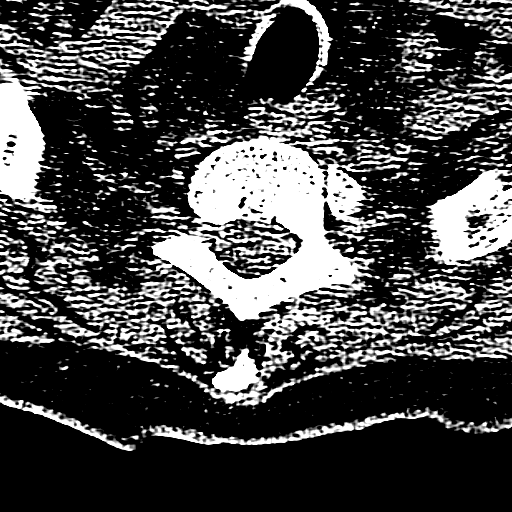
[im 12/90  bone]
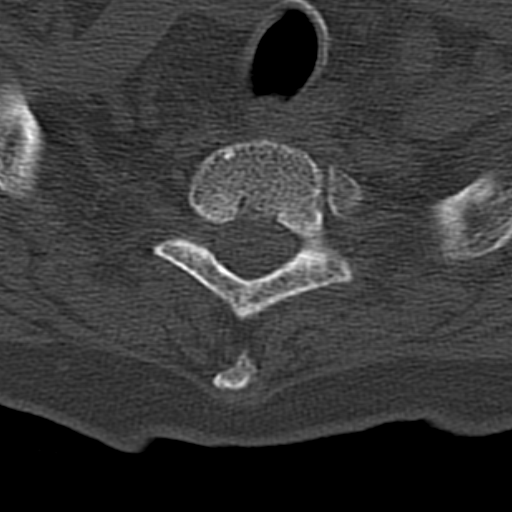
[im 23/90  brain]
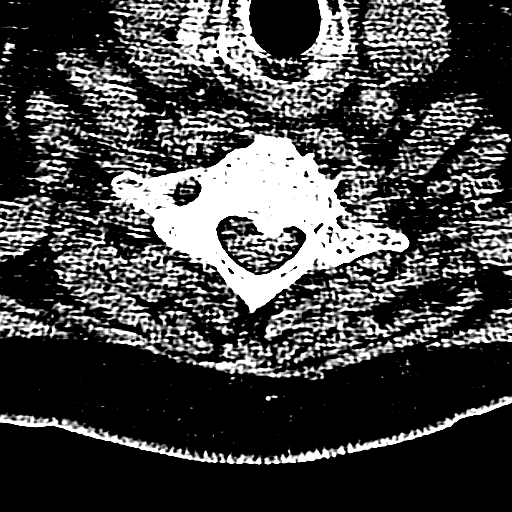
[im 34/90  brain]
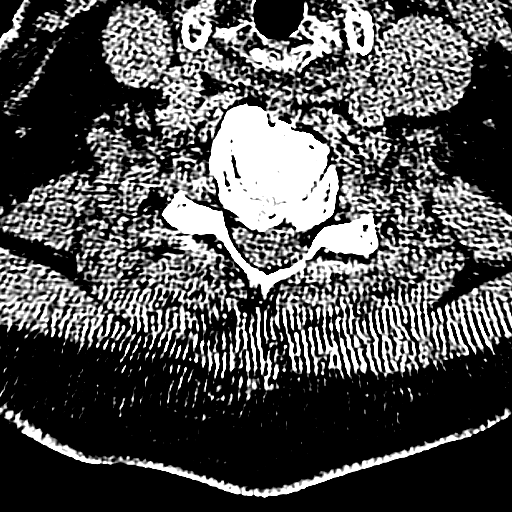
[im 45/90  brain]
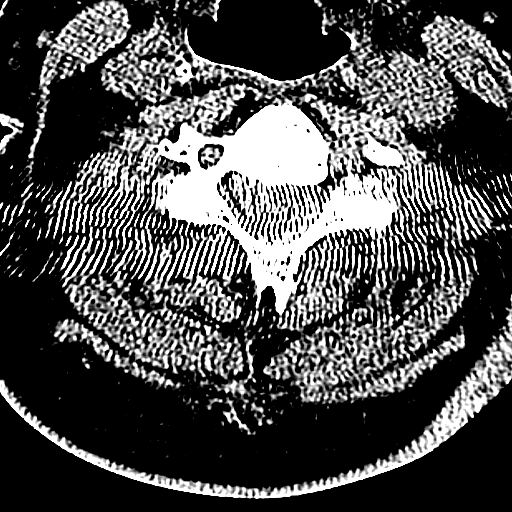
[im 56/90  brain]
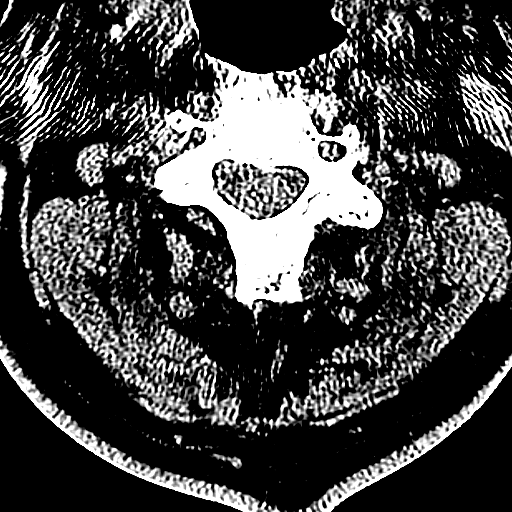
[im 56/90  bone]
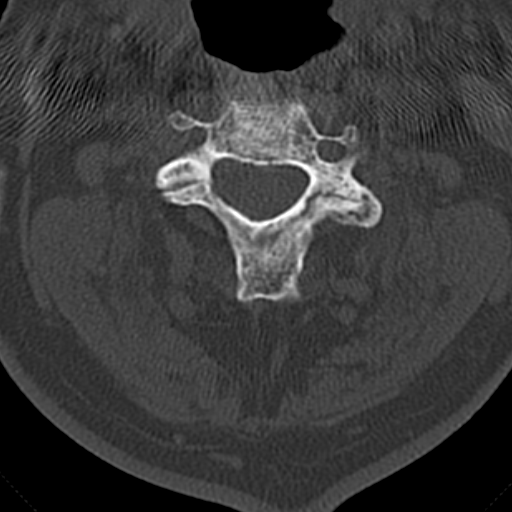
[im 67/90  brain]
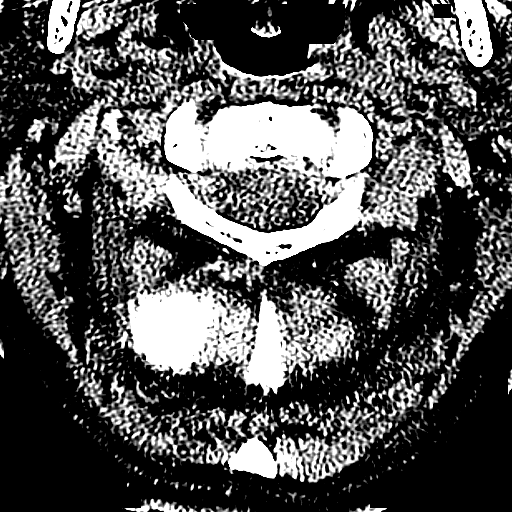
[im 78/90  brain]
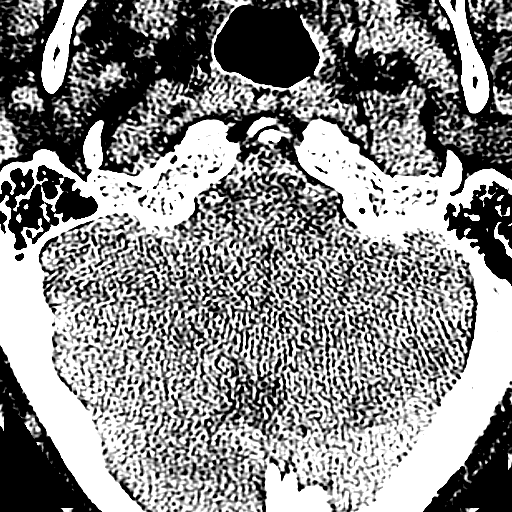

[Series 9: coronal recons · coronal · 0.20mm/px · 3 of 61 slices shown]
[im 21/61  brain]
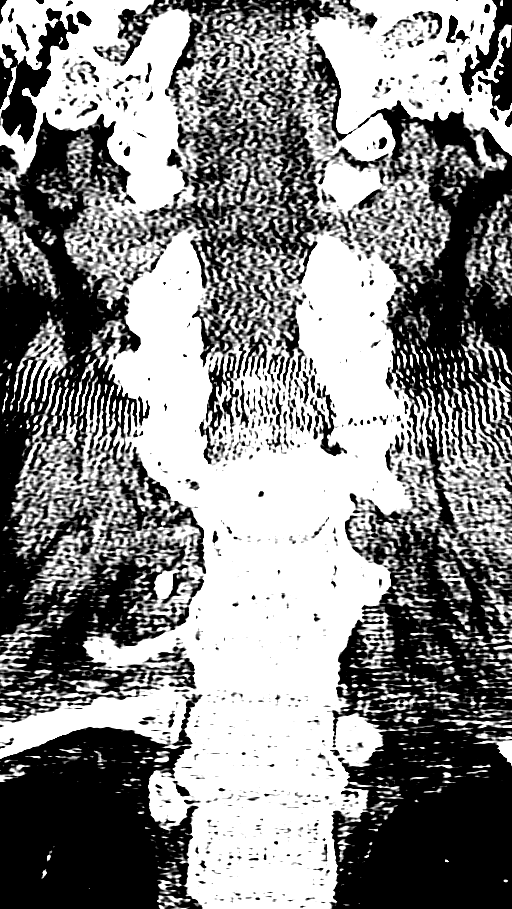
[im 27/61  brain]
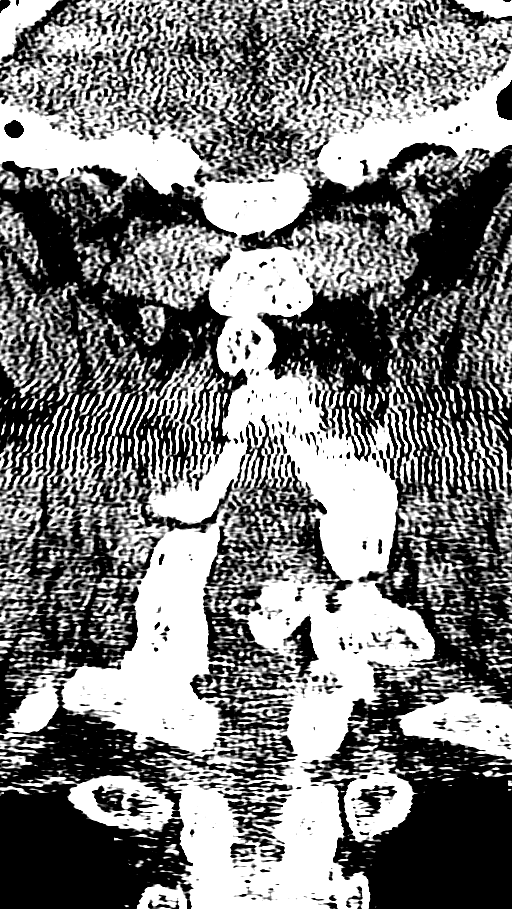
[im 34/61  brain]
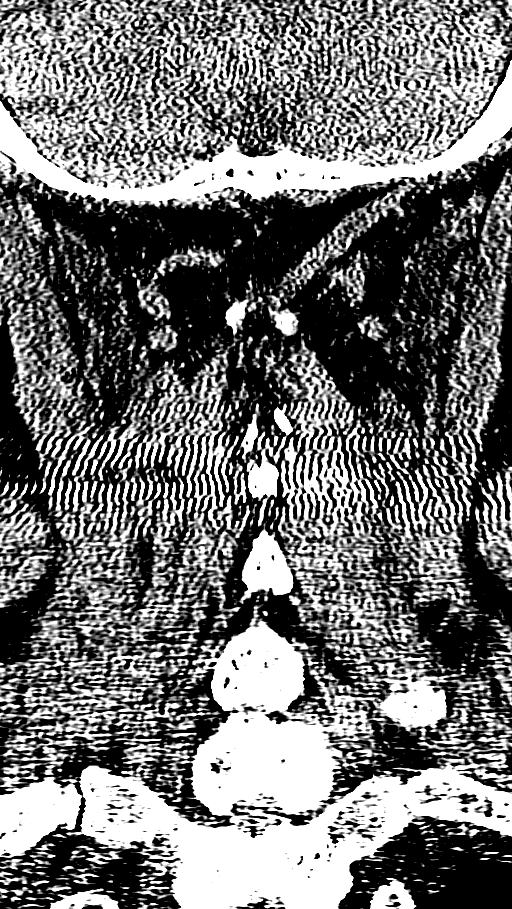

[Series 10: sagittal recons · sagittal · 0.27mm/px · 3 of 61 slices shown]
[im 21/61  brain]
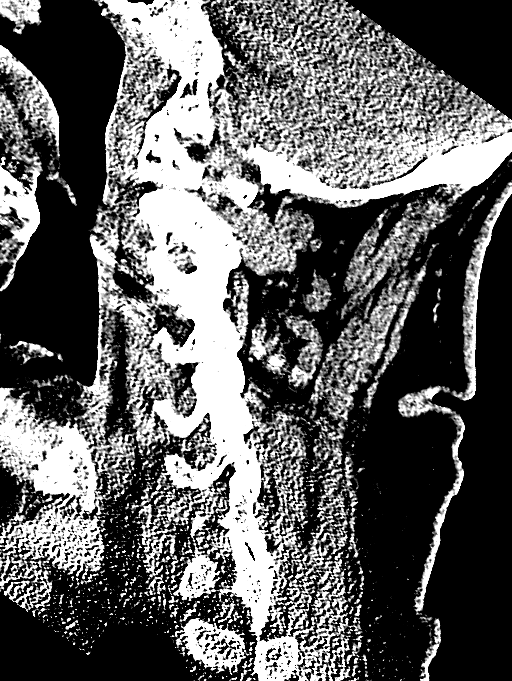
[im 31/61  brain]
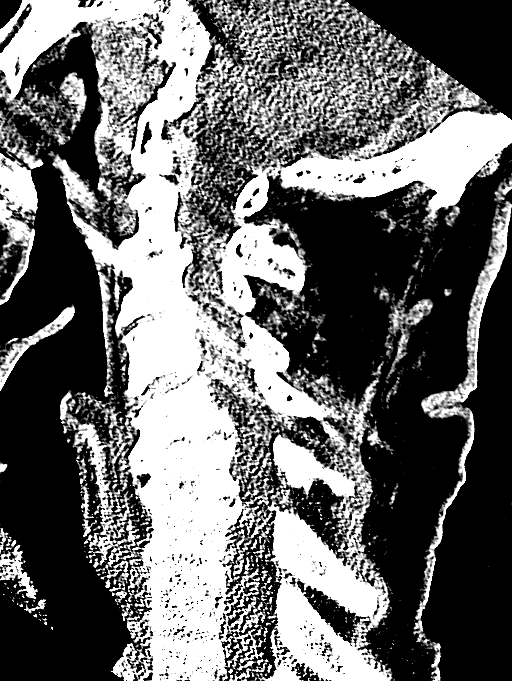
[im 41/61  brain]
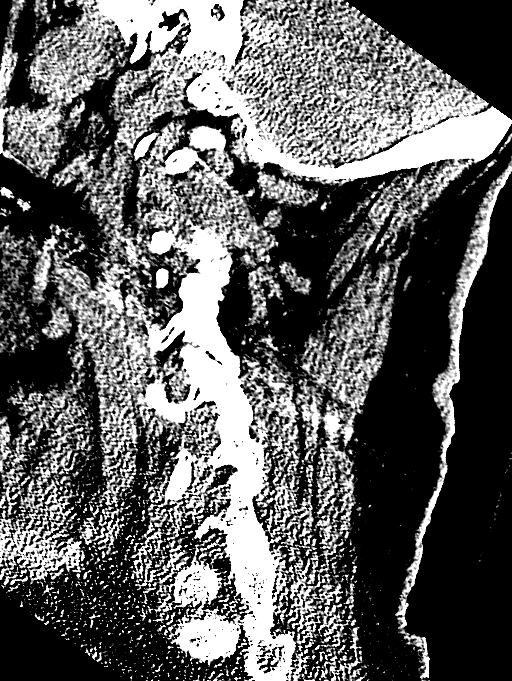

[13 of 47 positions shown; findings below may reference images not displayed]

FINDINGS: CT HEAD FINDINGS

Moderate atrophy and chronic small vessel ischemic change. Remote
lacunar infarct in the right basal ganglia. No intracranial
hemorrhage, mass effect, or midline shift. No evidence of
territorial infarct. No intracranial fluid collection. Posterior
scalp hematoma without associated fracture. Scattered sebaceous
cysts. Calvarium is intact. Included paranasal sinuses and mastoid
air cells are well aerated.

CT CERVICAL SPINE FINDINGS

No acute fracture or subluxation. There is stable multilevel
degenerative change throughout the cervical spine. Reversal of
normal lordosis at C5-C6, unchanged. Disc space narrowing at C5-C6
and C6-C7 and to a lesser extent C2-C3. Multilevel facet
arthropathy. No jumped or perched facets. There is a bone island in
C7. No prevertebral soft tissue edema.
IMPRESSION: 1. No acute intracranial abnormality. Stable atrophy and chronic
small vessel ischemic change.
2. Stable degenerative change in the cervical spine without acute
fracture or subluxation.

## 2016-07-21 DEATH — deceased
